# Patient Record
Sex: Male | Born: 1940 | Race: White | Hispanic: No | Marital: Married | Smoking: Never smoker
Health system: Southern US, Community
[De-identification: ages and names within clinical notes are randomized; demographics above are authoritative.]

## PROBLEM LIST (undated history)

## (undated) DIAGNOSIS — I219 Acute myocardial infarction, unspecified: Secondary | ICD-10-CM

## (undated) DIAGNOSIS — I499 Cardiac arrhythmia, unspecified: Secondary | ICD-10-CM

## (undated) DIAGNOSIS — I10 Essential (primary) hypertension: Secondary | ICD-10-CM

## (undated) HISTORY — DX: Essential (primary) hypertension: I10

## (undated) HISTORY — PX: INSERT / REPLACE / REMOVE PACEMAKER: SUR710

## (undated) HISTORY — PX: CORONARY ARTERY BYPASS GRAFT: SHX141

## (undated) HISTORY — PX: HERNIA REPAIR: SHX51

---

## 2007-12-01 ENCOUNTER — Ambulatory Visit (HOSPITAL_COMMUNITY): Admission: RE | Admit: 2007-12-01 | Discharge: 2007-12-01 | Payer: Self-pay | Admitting: General Surgery

## 2008-12-06 ENCOUNTER — Ambulatory Visit (HOSPITAL_COMMUNITY): Admission: RE | Admit: 2008-12-06 | Discharge: 2008-12-06 | Payer: Self-pay | Admitting: General Surgery

## 2008-12-06 ENCOUNTER — Encounter (INDEPENDENT_AMBULATORY_CARE_PROVIDER_SITE_OTHER): Payer: Self-pay | Admitting: General Surgery

## 2010-10-30 LAB — CBC
HCT: 41.3 % (ref 39.0–52.0)
Hemoglobin: 14.3 g/dL (ref 13.0–17.0)
MCHC: 34.7 g/dL (ref 30.0–36.0)
MCV: 91.8 fL (ref 78.0–100.0)
RBC: 4.5 MIL/uL (ref 4.22–5.81)
WBC: 6.9 10*3/uL (ref 4.0–10.5)

## 2010-10-30 LAB — DIFFERENTIAL
Basophils Relative: 0 % (ref 0–1)
Eosinophils Absolute: 0.2 10*3/uL (ref 0.0–0.7)
Lymphs Abs: 1.4 10*3/uL (ref 0.7–4.0)
Monocytes Absolute: 0.8 10*3/uL (ref 0.1–1.0)
Monocytes Relative: 11 % (ref 3–12)
Neutrophils Relative %: 66 % (ref 43–77)

## 2010-10-30 LAB — BASIC METABOLIC PANEL
CO2: 29 mEq/L (ref 19–32)
Chloride: 107 mEq/L (ref 96–112)
GFR calc Af Amer: >60 mL/min (ref >60)
Potassium: 4.4 mEq/L (ref 3.5–5.1)
Sodium: 140 mEq/L (ref 135–145)

## 2010-12-04 NOTE — Op Note (Signed)
NAMEMARKUS, [name]                ACCOUNT NO.:  [id number]   MEDICAL RECORD NO.:  [id number]          PATIENT TYPE:  AMB   LOCATION:  SDS                          FACILITY:  [facility]   PHYSICIAN:  Adolph Pollack, M.D.DATE OF BIRTH:  05/17/1941   DATE OF PROCEDURE:  12/01/2007  DATE OF DISCHARGE:                               OPERATIVE REPORT   PREOPERATIVE DIAGNOSIS:  Right inguinal hernia.   POSTOPERATIVE DIAGNOSIS:  Right inguinal hernia (pantaloon).   PROCEDURE:  Right inguinal hernia repair with mesh.   SURGEON:  Adolph Pollack, MD   ANESTHESIA:  General with Marcaine local.   INDICATIONS:  This is a 70 year old male who has had a right inguinal  hernia for some time.  It is becoming larger and now symptomatic.  He  now presents for repair.  The procedure risks and aftercare instructions  were explained to him preoperatively.   TECHNIQUE:  He was seen in the holding area and the right groin marked  with my initials.  He was then brought to the operating room, placed  supine on the operating table, and general anesthetic administered.  Hair on the right groin was clipped and the right groin was sterilely  prepped and draped.  Local anesthetic was infiltrated in the right  groin, superficial and deep.  An incision was made in the right groin  through the skin, subcutaneous tissue, and Scarpa fascia until the  external oblique aponeurosis was exposed.  Local anesthetic was  infiltrated deep to the external oblique aponeurosis.  An incision was  made in the external oblique aponeurosis through the external ring  medially and up toward the anterior-superior iliac spine laterally.  A  large underlying hernia was noted.  Using careful blunt dissection, I  was able to reduce the hernia from the scrotal area.  I then was able to  isolate the spermatic cord and using blunt dissection to separate it  from the hernia contents.  He had both an indirect and a direct hernia,  thus  a pantaloon hernia.  I was then able to manually reduce the hernia  contents back into the extraperitoneal space.  I left the ilioinguinal  nerve with the cord contents and identified the iliohypogastric nerve.  Using blunt dissection, I identified the internal oblique muscle and  aponeurosis superiorly and the shelving edge of the inguinal ligament  inferiorly.   I brought a piece of 3 x 6 inches polypropylene mesh into the field and  anchored it 2 cm medial to the pubic tubercle with 2-0 Prolene suture.  The inferior aspect of the mesh was then anchored to the shelving edge  of the inguinal ligament with a running 2-0 Prolene suture to a level of  1-2 cm lateral to the internal ring.  A slit was cut in the mesh and 2  tails were wrapped around the cord.  The superior aspect of the mesh was  then anchored to the internal oblique aponeurosis with interrupted 2-0  Vicryl sutures.  Following this, the 2 tails of the mesh were crossed  creating a new internal  ring and these were anchored to the shelving  edge of the inguinal ligament with a 2-0 Prolene suture.  This provided  for good coverage of the hernia defect with good reduction.   I then tucked the lateral aspect of mesh deep to the external oblique  aponeurosis.  Hemostasis was adequate.  I closed the external oblique  aponeurosis over the mesh and cord with running 3-0 Vicryl suture.  Scarpa fascia was closed with running 2-0 Vicryl suture.  The skin was  closed with a 4-0 Monocryl subcuticular stitch followed by Steri-Strips  and sterile dressing.  Right testicle was at its normal position in the  scrotum.   He tolerated the procedure well without any apparent complications and  was taken to recovery room in satisfactory condition.      Adolph Pollack, M.D.  Electronically Signed     TJR/MEDQ  D:  12/01/2007  T:  12/02/2007  Job:  [id number]   cc:   Dossie Der, MD

## 2010-12-04 NOTE — Op Note (Signed)
[name], [name]                ACCOUNT NO.:  [id number]   MEDICAL RECORD NO.:  [id number]          PATIENT TYPE:  AMB   LOCATION:  SDS                          FACILITY:  [facility]   PHYSICIAN:  Adolph Pollack, M.D.DATE OF BIRTH:  11/23/1940   DATE OF PROCEDURE:  12/06/2008  DATE OF DISCHARGE:  12/06/2008                               OPERATIVE REPORT   PREOPERATIVE DIAGNOSIS:  Left inguinal hernia.   POSTOPERATIVE DIAGNOSIS:  Left inguinal hernia (pantaloon).   PROCEDURE:  Left inguinal hernia with mesh.   SURGEON:  Adolph Pollack, MD   ANESTHESIA:  General plus Marcaine local.   INDICATIONS:  This is a 70 year old male who had a right inguinal hernia  repair in the past.  He slipped off his tractor and felt a tearing  sensation in left groin and now he has a left inguinal hernia that is  quite a bit more painful than his right inguinal hernia.  He presents  for repair.  We have discussed the procedure risks and aftercare  preoperatively.   TECHNIQUE:  His left inguinal area was marked with my initials in the  holding room.  He was then brought to the operating room, placed supine  on the operating table and a general anesthetic was administered.  The  hair in the left groin was clipped and the area was sterilely prepped  and draped.  Local anesthetic consisting of 0.5% Marcaine with  epinephrine was infiltrated in the left groin region superficially and  deep.  A left groin incision was made through the skin, subcutaneous  tissue and Scarpa fascia until the external oblique aponeurosis was  exposed.  Local anesthetic was infiltrated deep to the external oblique  aponeurosis.  An incision was made in the external oblique aponeurosis  through the external ring medially and up toward the anterior superior  iliac spine laterally.  The underlying ilioinguinal nerve was identified  and preserved.   Using blunt dissection, the shelving edge of the inguinal ligament was  identified inferiorly and internal oblique aponeurosis was identified  superiorly.  At the level of the pubic tubercle, the spermatic cord was  encircled and I noticed both direct hernia and indirect hernia  consistent with a pantaloon type hernia.  I dissected the direct hernia  extraperitoneal fat from the cord.  I then dissected the indirect sac  from the cord and excised some of the extra peritoneal fat setting it as  hernia contents.  There was a large internal ring defect present.   Following this, I retracted the cord structures anteriorly.  A piece of  3- x 6-inch polypropylene mesh and brought into the field and anchored 2  cm medial to the pubic tubercle with a 2-0 Prolene suture.  The inferior  aspect of the mesh was anchored to the shelving edge of the inguinal  ligament and the running 2-0 Prolene suture up to level 2 cm lateral to  the internal ring.  A slit was cut in the mesh creating 2 tails and  these were wrapped around spermatic cord.  The superior aspect of  the  mesh was then anchored to the internal oblique aponeurosis with  interrupted 2-0 Vicryl sutures.  This provided for adequate  coverage of  the indirect and indirect defects.   Following this, the 2 tails were crossed creating a new tight internal  ring and this was anchored to the shelving edge of the inguinal ligament  with a single 2-0 Prolene suture.  The lateral aspect of the mesh was  then tucked deep to the external oblique aponeurosis.   The wound was inspected and hemostasis was adequate.  The external  oblique aponeurosis was closed over the mesh and cord with running 3-0  Vicryl suture.  Scarpa fascia was reapproximated 2-0 Vicryl suture.  The  skin was closed with a 4-0 Monocryl subcuticular stitch followed by  Steri-Strips and sterile dressings.   He tolerated the procedure well without any apparent complications.  The  left testicle was in its normal position and scrotum.  He was taken to  the  recovery room in satisfactory condition.      Adolph Pollack, M.D.  Electronically Signed     TJR/MEDQ  D:  12/06/2008  T:  12/06/2008  Job:  [id number]

## 2011-09-10 ENCOUNTER — Other Ambulatory Visit: Payer: Self-pay | Admitting: Orthopedic Surgery

## 2011-09-10 DIAGNOSIS — M545 Low back pain: Secondary | ICD-10-CM

## 2011-09-16 ENCOUNTER — Other Ambulatory Visit: Payer: Self-pay | Admitting: Orthopedic Surgery

## 2011-09-16 DIAGNOSIS — M545 Low back pain: Secondary | ICD-10-CM

## 2011-09-17 ENCOUNTER — Ambulatory Visit
Admission: RE | Admit: 2011-09-17 | Discharge: 2011-09-17 | Disposition: A | Discharge status: Home / Self Care | Payer: Medicare Other | Source: Ambulatory Visit | Attending: Orthopedic Surgery | Admitting: Orthopedic Surgery

## 2011-09-17 DIAGNOSIS — M545 Low back pain: Secondary | ICD-10-CM

## 2011-11-29 ENCOUNTER — Encounter (HOSPITAL_COMMUNITY): Payer: Self-pay | Admitting: Pharmacy Technician

## 2011-11-29 ENCOUNTER — Other Ambulatory Visit: Payer: Self-pay | Admitting: Orthopedic Surgery

## 2011-12-03 ENCOUNTER — Encounter (HOSPITAL_COMMUNITY)
Admission: RE | Admit: 2011-12-03 | Discharge: 2011-12-03 | Disposition: A | Discharge status: Home / Self Care | Payer: Medicare Other | Source: Ambulatory Visit | Attending: Orthopedic Surgery | Admitting: Orthopedic Surgery

## 2011-12-03 ENCOUNTER — Encounter (HOSPITAL_COMMUNITY): Payer: Self-pay

## 2011-12-03 HISTORY — DX: Acute myocardial infarction, unspecified: I21.9

## 2011-12-03 HISTORY — DX: Cardiac arrhythmia, unspecified: I49.9

## 2011-12-03 LAB — CBC
Hemoglobin: 14.4 g/dL (ref 13.0–17.0)
Platelets: 242 10*3/uL (ref 150–400)
RBC: 4.55 MIL/uL (ref 4.22–5.81)
WBC: 11.9 10*3/uL — ABNORMAL HIGH (ref 4.0–10.5)

## 2011-12-03 LAB — DIFFERENTIAL
Lymphs Abs: 1.4 10*3/uL (ref 0.7–4.0)
Monocytes Relative: 12 % (ref 3–12)
Neutro Abs: 8.9 10*3/uL — ABNORMAL HIGH (ref 1.7–7.7)
Neutrophils Relative %: 75 % (ref 43–77)

## 2011-12-03 LAB — SURGICAL PCR SCREEN
MRSA, PCR: NEGATIVE
Staphylococcus aureus: NEGATIVE

## 2011-12-03 LAB — COMPREHENSIVE METABOLIC PANEL
AST: 19 U/L (ref 0–37)
Albumin: 3.8 g/dL (ref 3.5–5.2)
Alkaline Phosphatase: 41 U/L (ref 39–117)
Chloride: 103 mEq/L (ref 96–112)
Creatinine, Ser: 0.76 mg/dL (ref 0.50–1.35)
Potassium: 4.3 mEq/L (ref 3.5–5.1)
Total Bilirubin: 0.3 mg/dL (ref 0.3–1.2)
Total Protein: 7.2 g/dL (ref 6.0–8.3)

## 2011-12-03 LAB — ABO/RH: ABO/RH(D): A NEG

## 2011-12-03 LAB — URINALYSIS, ROUTINE W REFLEX MICROSCOPIC
Leukocytes, UA: NEGATIVE
Nitrite: NEGATIVE
Specific Gravity, Urine: 1.024 (ref 1.005–1.030)
pH: 5.5 (ref 5.0–8.0)

## 2011-12-03 LAB — APTT: aPTT: 29 seconds (ref 24–37)

## 2011-12-03 LAB — TYPE AND SCREEN: Antibody Screen: NEGATIVE

## 2011-12-03 LAB — PROTIME-INR
INR: 0.94 (ref 0.00–1.49)
Prothrombin Time: 12.8 seconds (ref 11.6–15.2)

## 2011-12-03 NOTE — Progress Notes (Signed)
Leta Jungling with [employer] notified

## 2011-12-03 NOTE — Pre-Procedure Instructions (Signed)
20 [name]  12/03/2011   Your procedure is scheduled on:  12/05/11  Report to [facility] at 845 AM.  Call this number if you have problems the morning of surgery: [phone]   Remember:   Do not eat food:After Midnight.  May have clear liquids: up to 4 Hours before arrival.  Clear liquids include soda, tea, black coffee, apple or grape juice, broth.  Take these medicines the morning of surgery with A SIP OF WATER: norvasc,lanoxin,eye drops,   Do not wear jewelry, make-up or nail polish.  Do not wear lotions, powders, or perfumes. You may wear deodorant.  Do not shave 48 hours prior to surgery. Men may shave face and neck.  Do not bring valuables to the hospital.  Contacts, dentures or bridgework may not be worn into surgery.  Leave suitcase in the car. After surgery it may be brought to your room.  For patients admitted to the hospital, checkout time is 11:00 AM the day of discharge.   Patients discharged the day of surgery will not be allowed to drive home.  Name and phone number of your driver: family  Special Instructions: CHG Shower Use Special Wash: 1/2 bottle night before surgery and 1/2 bottle morning of surgery.   Please read over the following fact sheets that you were given: Pain Booklet, Coughing and Deep Breathing, Blood Transfusion Information, MRSA Information and Surgical Site Infection Prevention

## 2011-12-03 NOTE — Progress Notes (Signed)
Form faxed to dr Graciela Husbands  In [place]. [place].

## 2011-12-03 NOTE — Progress Notes (Signed)
#   [id number]      Requested all ccardiac info ekg,stress  test  From dr Graciela Husbands.Marland Kitchen

## 2011-12-04 ENCOUNTER — Encounter (HOSPITAL_COMMUNITY): Payer: Self-pay | Admitting: Vascular Surgery

## 2011-12-04 MED ORDER — CEFAZOLIN SODIUM-DEXTROSE 2-3 GM-% IV SOLR
2.0000 g | INTRAVENOUS | Status: DC
  Filled 2011-12-04: qty 50

## 2011-12-04 NOTE — Consult Note (Addendum)
Anesthesia Chart Review:  Patient is a 71 year old male scheduled for left L4-5 transforaminal lumbar interbody fusion on 12/05/11.  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  Cardiologist is Dr. Graciela Husbands [phone]) in [place], [place].  He was last seen on 09/26/11 for preoperative evaluation.    EKG from 12/03/11 showed: NSR, LVH with repolarization abnormality, cannot rule out inferior infarct (age undetermined).  Cardiology notes say he had a CABG X 3 '04 (LIMA to LAD, SVG to D1, SVG to PDA) with last LHC in 06/09/09 showing all grafts patent.  They also say he had an echo on 02/12/11 showing EF 40-45%, grade I diastolic dysfunction, mild MR, aortic sclerosis, early LVH, moderate AR, trace TR, and four chamber enlargement.       Labs acceptable.  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 Postoperative changes of CABG.   I spoke with Brook at [facility] office.  Patient is going to their office today (12/04/11) around 1500 for his pacemaker check.  She said his perioperative cardiac device Rx form will be faxed after his visit.  Addendum: 12/04/11 1630 Reviewed additional records from [facility] and [facility].  His last cath was on 06/09/09, although grafts were patent he did have a complex ~ 80% lesion at the anastomotic site of the SVG to PDA graft.  He was transferred to [facility] for consideration of PCI; however, PCI was ultimately not felt justified and medical management was recommended.  Shonna Chock, PA-C

## 2011-12-05 ENCOUNTER — Inpatient Hospital Stay (HOSPITAL_COMMUNITY): Payer: Medicare Other

## 2011-12-05 ENCOUNTER — Inpatient Hospital Stay (HOSPITAL_COMMUNITY)
Admission: RE | Admit: 2011-12-05 | Discharge: 2011-12-07 | DRG: 460 | Disposition: A | Discharge status: Home Health | Payer: Medicare Other | Source: Ambulatory Visit | Attending: Orthopedic Surgery | Admitting: Orthopedic Surgery

## 2011-12-05 ENCOUNTER — Encounter (HOSPITAL_COMMUNITY): Payer: Self-pay | Admitting: Vascular Surgery

## 2011-12-05 ENCOUNTER — Inpatient Hospital Stay (HOSPITAL_COMMUNITY): Payer: Medicare Other | Admitting: Vascular Surgery

## 2011-12-05 ENCOUNTER — Encounter (HOSPITAL_COMMUNITY): Payer: Self-pay | Admitting: *Deleted

## 2011-12-05 ENCOUNTER — Encounter (HOSPITAL_COMMUNITY)
Admission: RE | Disposition: A | Discharge status: Home Health | Payer: Self-pay | Source: Ambulatory Visit | Attending: Orthopedic Surgery

## 2011-12-05 DIAGNOSIS — Z01818 Encounter for other preprocedural examination: Secondary | ICD-10-CM

## 2011-12-05 DIAGNOSIS — M545 Low back pain: Secondary | ICD-10-CM

## 2011-12-05 DIAGNOSIS — H409 Unspecified glaucoma: Secondary | ICD-10-CM | POA: Diagnosis present

## 2011-12-05 DIAGNOSIS — Z951 Presence of aortocoronary bypass graft: Secondary | ICD-10-CM

## 2011-12-05 DIAGNOSIS — Z01812 Encounter for preprocedural laboratory examination: Secondary | ICD-10-CM

## 2011-12-05 DIAGNOSIS — Z7902 Long term (current) use of antithrombotics/antiplatelets: Secondary | ICD-10-CM

## 2011-12-05 DIAGNOSIS — M5126 Other intervertebral disc displacement, lumbar region: Secondary | ICD-10-CM | POA: Diagnosis present

## 2011-12-05 DIAGNOSIS — Z79899 Other long term (current) drug therapy: Secondary | ICD-10-CM

## 2011-12-05 DIAGNOSIS — Z7982 Long term (current) use of aspirin: Secondary | ICD-10-CM

## 2011-12-05 DIAGNOSIS — Q762 Congenital spondylolisthesis: Principal | ICD-10-CM | POA: Diagnosis present

## 2011-12-05 DIAGNOSIS — I10 Essential (primary) hypertension: Secondary | ICD-10-CM | POA: Diagnosis present

## 2011-12-05 DIAGNOSIS — Z01810 Encounter for preprocedural cardiovascular examination: Secondary | ICD-10-CM

## 2011-12-05 DIAGNOSIS — I252 Old myocardial infarction: Secondary | ICD-10-CM

## 2011-12-05 DIAGNOSIS — M5417 Radiculopathy, lumbosacral region: Secondary | ICD-10-CM | POA: Diagnosis present

## 2011-12-05 SURGERY — POSTERIOR LUMBAR FUSION 1 LEVEL
Anesthesia: General | Site: Spine Lumbar | Laterality: Left | Wound class: Clean

## 2011-12-05 MED ORDER — THROMBIN 20000 UNITS EX KIT
PACK | CUTANEOUS | Status: DC | PRN
  Administered 2011-12-05: 14:00:00 via TOPICAL

## 2011-12-05 MED ORDER — ADULT MULTIVITAMIN W/MINERALS CH
1.0000 | ORAL_TABLET | Freq: Every day | ORAL | Status: DC
  Administered 2011-12-06 – 2011-12-07 (×2): 1 via ORAL
  Filled 2011-12-05 (×2): qty 1

## 2011-12-05 MED ORDER — BUPIVACAINE-EPINEPHRINE 0.25% -1:200000 IJ SOLN
INTRAMUSCULAR | Status: DC | PRN
  Administered 2011-12-05: 6 mL

## 2011-12-05 MED ORDER — ATORVASTATIN CALCIUM 80 MG PO TABS
80.0000 mg | ORAL_TABLET | Freq: Every day | ORAL | Status: DC
  Administered 2011-12-05 – 2011-12-06 (×2): 80 mg via ORAL
  Filled 2011-12-05 (×3): qty 1

## 2011-12-05 MED ORDER — PROPOFOL 10 MG/ML IV EMUL
INTRAVENOUS | Status: DC | PRN
  Administered 2011-12-05: 115 mg via INTRAVENOUS

## 2011-12-05 MED ORDER — ACETAMINOPHEN 10 MG/ML IV SOLN
INTRAVENOUS | Status: AC
  Filled 2011-12-05: qty 100

## 2011-12-05 MED ORDER — ZOLPIDEM TARTRATE 5 MG PO TABS: 5.0000 mg | ORAL_TABLET | Freq: Every evening | ORAL | Status: DC | PRN

## 2011-12-05 MED ORDER — LOSARTAN POTASSIUM 50 MG PO TABS
100.0000 mg | ORAL_TABLET | Freq: Every day | ORAL | Status: DC
Start: 2011-12-06 — End: 2011-12-07
  Administered 2011-12-06 – 2011-12-07 (×2): 100 mg via ORAL
  Filled 2011-12-05 (×2): qty 2

## 2011-12-05 MED ORDER — MENTHOL 3 MG MT LOZG: 1.0000 | LOZENGE | OROMUCOSAL | Status: DC | PRN

## 2011-12-05 MED ORDER — AMLODIPINE BESYLATE 5 MG PO TABS
5.0000 mg | ORAL_TABLET | Freq: Every day | ORAL | Status: DC
  Administered 2011-12-06 – 2011-12-07 (×2): 5 mg via ORAL
  Filled 2011-12-05 (×2): qty 1

## 2011-12-05 MED ORDER — NITROGLYCERIN 0.4 MG SL SUBL: 0.4000 mg | SUBLINGUAL_TABLET | SUBLINGUAL | Status: DC | PRN

## 2011-12-05 MED ORDER — ACETAMINOPHEN 325 MG PO TABS: 650.0000 mg | ORAL_TABLET | ORAL | Status: DC | PRN

## 2011-12-05 MED ORDER — NALOXONE HCL 0.4 MG/ML IJ SOLN: 0.4000 mg | INTRAMUSCULAR | Status: DC | PRN

## 2011-12-05 MED ORDER — PILOCARPINE HCL 4 % OP SOLN
1.0000 [drp] | Freq: Four times a day (QID) | OPHTHALMIC | Status: DC
  Administered 2011-12-05 – 2011-12-07 (×4): 1 [drp] via OPHTHALMIC
  Filled 2011-12-05: qty 15

## 2011-12-05 MED ORDER — VITAMIN D3 25 MCG (1000 UNIT) PO TABS
2000.0000 [IU] | ORAL_TABLET | Freq: Every day | ORAL | Status: DC
  Administered 2011-12-05 – 2011-12-07 (×3): 2000 [IU] via ORAL
  Filled 2011-12-05 (×3): qty 2

## 2011-12-05 MED ORDER — ONDANSETRON HCL 4 MG/2ML IJ SOLN: 4.0000 mg | Freq: Four times a day (QID) | INTRAMUSCULAR | Status: DC | PRN

## 2011-12-05 MED ORDER — EPHEDRINE SULFATE 50 MG/ML IJ SOLN
INTRAMUSCULAR | Status: DC | PRN
  Administered 2011-12-05: 5 mg via INTRAVENOUS
  Administered 2011-12-05 (×2): 10 mg via INTRAVENOUS

## 2011-12-05 MED ORDER — SODIUM CHLORIDE 0.9 % IJ SOLN: 9.0000 mL | INTRAMUSCULAR | Status: DC | PRN

## 2011-12-05 MED ORDER — DIPHENHYDRAMINE HCL 12.5 MG/5ML PO ELIX: 12.5000 mg | ORAL_SOLUTION | Freq: Four times a day (QID) | ORAL | Status: DC | PRN

## 2011-12-05 MED ORDER — SODIUM CHLORIDE 0.9 % IV SOLN: 250.0000 mL | INTRAVENOUS | Status: DC

## 2011-12-05 MED ORDER — PNEUMOCOCCAL VAC POLYVALENT 25 MCG/0.5ML IJ INJ
0.5000 mL | INJECTION | INTRAMUSCULAR | Status: DC
  Filled 2011-12-05: qty 0.5

## 2011-12-05 MED ORDER — ALBUMIN HUMAN 5 % IV SOLN
INTRAVENOUS | Status: DC | PRN
  Administered 2011-12-05: 13:00:00 via INTRAVENOUS

## 2011-12-05 MED ORDER — KETOROLAC TROMETHAMINE 30 MG/ML IJ SOLN: 15.0000 mg | Freq: Once | INTRAMUSCULAR | Status: DC | PRN

## 2011-12-05 MED ORDER — DIAZEPAM 5 MG PO TABS: 5.0000 mg | ORAL_TABLET | Freq: Four times a day (QID) | ORAL | Status: DC | PRN

## 2011-12-05 MED ORDER — OXYCODONE-ACETAMINOPHEN 5-325 MG PO TABS
1.0000 | ORAL_TABLET | ORAL | Status: DC | PRN
  Administered 2011-12-06 – 2011-12-07 (×3): 1 via ORAL
  Filled 2011-12-05 (×3): qty 1

## 2011-12-05 MED ORDER — DORZOLAMIDE HCL-TIMOLOL MAL 2-0.5 % OP SOLN
1.0000 [drp] | Freq: Two times a day (BID) | OPHTHALMIC | Status: DC
  Administered 2011-12-05 – 2011-12-07 (×3): 1 [drp] via OPHTHALMIC
  Filled 2011-12-05: qty 10

## 2011-12-05 MED ORDER — DOCUSATE SODIUM 100 MG PO CAPS
100.0000 mg | ORAL_CAPSULE | Freq: Two times a day (BID) | ORAL | Status: DC
  Administered 2011-12-05 – 2011-12-07 (×4): 100 mg via ORAL
  Filled 2011-12-05 (×5): qty 1

## 2011-12-05 MED ORDER — OXYCODONE HCL 10 MG PO TB12
10.0000 mg | ORAL_TABLET | Freq: Two times a day (BID) | ORAL | Status: DC
  Administered 2011-12-06 – 2011-12-07 (×3): 10 mg via ORAL
  Filled 2011-12-05 (×3): qty 1

## 2011-12-05 MED ORDER — SENNA 8.6 MG PO TABS
1.0000 | ORAL_TABLET | Freq: Two times a day (BID) | ORAL | Status: DC
  Administered 2011-12-05 – 2011-12-07 (×4): 8.6 mg via ORAL
  Filled 2011-12-05 (×5): qty 1

## 2011-12-05 MED ORDER — MORPHINE SULFATE 2 MG/ML IJ SOLN: 2.0000 mg | INTRAMUSCULAR | Status: DC | PRN

## 2011-12-05 MED ORDER — LACTATED RINGERS IV SOLN
INTRAVENOUS | Status: DC
  Administered 2011-12-05: 10:00:00 via INTRAVENOUS

## 2011-12-05 MED ORDER — FENTANYL CITRATE 0.05 MG/ML IJ SOLN
INTRAMUSCULAR | Status: DC | PRN
  Administered 2011-12-05 (×2): 50 ug via INTRAVENOUS
  Administered 2011-12-05 (×4): 100 ug via INTRAVENOUS

## 2011-12-05 MED ORDER — SODIUM CHLORIDE 0.9 % IJ SOLN: 3.0000 mL | INTRAMUSCULAR | Status: DC | PRN

## 2011-12-05 MED ORDER — ACETAMINOPHEN 10 MG/ML IV SOLN
INTRAVENOUS | Status: DC | PRN
  Administered 2011-12-05: 1000 mg via INTRAVENOUS

## 2011-12-05 MED ORDER — ONDANSETRON HCL 4 MG/2ML IJ SOLN: 4.0000 mg | INTRAMUSCULAR | Status: DC | PRN

## 2011-12-05 MED ORDER — ONDANSETRON HCL 4 MG/2ML IJ SOLN
INTRAMUSCULAR | Status: DC | PRN
  Administered 2011-12-05: 4 mg via INTRAVENOUS

## 2011-12-05 MED ORDER — ALUM & MAG HYDROXIDE-SIMETH 200-200-20 MG/5ML PO SUSP: 30.0000 mL | Freq: Four times a day (QID) | ORAL | Status: DC | PRN

## 2011-12-05 MED ORDER — POTASSIUM CHLORIDE IN NACL 20-0.9 MEQ/L-% IV SOLN
INTRAVENOUS | Status: DC
  Administered 2011-12-06: 06:00:00 via INTRAVENOUS
  Filled 2011-12-05 (×5): qty 1000

## 2011-12-05 MED ORDER — DIGOXIN 125 MCG PO TABS
125.0000 ug | ORAL_TABLET | Freq: Every day | ORAL | Status: DC
  Administered 2011-12-06 – 2011-12-07 (×2): 125 ug via ORAL
  Filled 2011-12-05 (×2): qty 1

## 2011-12-05 MED ORDER — MORPHINE SULFATE (PF) 1 MG/ML IV SOLN
INTRAVENOUS | Status: DC
  Administered 2011-12-05: 2 mg via INTRAVENOUS
  Administered 2011-12-05: 17:00:00 via INTRAVENOUS
  Administered 2011-12-06: 2 mg via INTRAVENOUS
  Administered 2011-12-06: 4 mg via INTRAVENOUS

## 2011-12-05 MED ORDER — PHENOL 1.4 % MT LIQD: 1.0000 | OROMUCOSAL | Status: DC | PRN

## 2011-12-05 MED ORDER — ROCURONIUM BROMIDE 100 MG/10ML IV SOLN
INTRAVENOUS | Status: DC | PRN
  Administered 2011-12-05: 50 mg via INTRAVENOUS

## 2011-12-05 MED ORDER — PHENYLEPHRINE HCL 10 MG/ML IJ SOLN
INTRAMUSCULAR | Status: DC | PRN
  Administered 2011-12-05: 120 ug via INTRAVENOUS
  Administered 2011-12-05: 40 ug via INTRAVENOUS
  Administered 2011-12-05 (×2): 100 ug via INTRAVENOUS

## 2011-12-05 MED ORDER — PROMETHAZINE HCL 25 MG/ML IJ SOLN: 6.2500 mg | INTRAMUSCULAR | Status: DC | PRN

## 2011-12-05 MED ORDER — VECURONIUM BROMIDE 10 MG IV SOLR
INTRAVENOUS | Status: DC | PRN
  Administered 2011-12-05: 4 mg via INTRAVENOUS
  Administered 2011-12-05 (×2): 1 mg via INTRAVENOUS

## 2011-12-05 MED ORDER — POVIDONE-IODINE 7.5 % EX SOLN
Freq: Once | CUTANEOUS | Status: DC
  Filled 2011-12-05: qty 118

## 2011-12-05 MED ORDER — DIPHENHYDRAMINE HCL 50 MG/ML IJ SOLN: 12.5000 mg | Freq: Four times a day (QID) | INTRAMUSCULAR | Status: DC | PRN

## 2011-12-05 MED ORDER — CEFAZOLIN SODIUM 1-5 GM-% IV SOLN
1.0000 g | Freq: Three times a day (TID) | INTRAVENOUS | Status: AC
  Administered 2011-12-05 – 2011-12-06 (×2): 1 g via INTRAVENOUS
  Filled 2011-12-05 (×2): qty 50

## 2011-12-05 MED ORDER — LACTATED RINGERS IV SOLN
INTRAVENOUS | Status: DC | PRN
  Administered 2011-12-05 (×2): via INTRAVENOUS

## 2011-12-05 MED ORDER — ACETAMINOPHEN 650 MG RE SUPP: 650.0000 mg | RECTAL | Status: DC | PRN

## 2011-12-05 MED ORDER — VITAMIN D 50 MCG (2000 UT) PO CAPS: 1.0000 | ORAL_CAPSULE | Freq: Every day | ORAL | Status: DC

## 2011-12-05 MED ORDER — SODIUM CHLORIDE 0.9 % IJ SOLN
3.0000 mL | Freq: Two times a day (BID) | INTRAMUSCULAR | Status: DC
  Administered 2011-12-06 – 2011-12-07 (×2): 3 mL via INTRAVENOUS

## 2011-12-05 MED ORDER — HYDROMORPHONE HCL PF 1 MG/ML IJ SOLN: 0.2500 mg | INTRAMUSCULAR | Status: DC | PRN

## 2011-12-05 MED ORDER — THROMBIN 20000 UNITS EX SOLR
CUTANEOUS | Status: DC | PRN
  Administered 2011-12-05: 20000 [IU] via TOPICAL

## 2011-12-05 MED ORDER — LIDOCAINE HCL (CARDIAC) 20 MG/ML IV SOLN
INTRAVENOUS | Status: DC | PRN
  Administered 2011-12-05: 100 mg via INTRAVENOUS

## 2011-12-05 SURGICAL SUPPLY — 73 items
BENZOIN TINCTURE PRP APPL 2/3 (GAUZE/BANDAGES/DRESSINGS) IMPLANT
BLADE SURG ROTATE 9660 (MISCELLANEOUS) ×2 IMPLANT
BUR ROUND PRECISION 4.0 (BURR) ×2 IMPLANT
CAGE CONCORDE BULLET 11X13X27 (Cage) ×2 IMPLANT
CARTRIDGE OIL MAESTRO DRILL (MISCELLANEOUS) ×1 IMPLANT
CLOTH BEACON ORANGE TIMEOUT ST (SAFETY) ×2 IMPLANT
CLSR STERI-STRIP ANTIMIC 1/2X4 (GAUZE/BANDAGES/DRESSINGS) ×2 IMPLANT
CONT SPEC STER OR (MISCELLANEOUS) ×4 IMPLANT
CORDS BIPOLAR (ELECTRODE) ×2 IMPLANT
COVER SURGICAL LIGHT HANDLE (MISCELLANEOUS) ×2 IMPLANT
DIFFUSER DRILL AIR PNEUMATIC (MISCELLANEOUS) ×2 IMPLANT
DRAIN CHANNEL 15F RND FF W/TCR (WOUND CARE) IMPLANT
DRAPE C-ARM 42X72 X-RAY (DRAPES) ×2 IMPLANT
DRAPE ORTHO SPLIT 77X108 STRL (DRAPES) ×1
DRAPE POUCH INSTRU U-SHP 10X18 (DRAPES) ×2 IMPLANT
DRAPE SURG 17X23 STRL (DRAPES) ×6 IMPLANT
DRAPE SURG ORHT 6 SPLT 77X108 (DRAPES) ×1 IMPLANT
DURAPREP 26ML APPLICATOR (WOUND CARE) ×2 IMPLANT
ELECT BLADE 4.0 EZ CLEAN MEGAD (MISCELLANEOUS) ×2
ELECT CAUTERY BLADE 6.4 (BLADE) ×2 IMPLANT
ELECT REM PT RETURN 9FT ADLT (ELECTROSURGICAL) ×2
ELECTRODE BLDE 4.0 EZ CLN MEGD (MISCELLANEOUS) ×1 IMPLANT
ELECTRODE REM PT RTRN 9FT ADLT (ELECTROSURGICAL) ×1 IMPLANT
EVACUATOR SILICONE 100CC (DRAIN) IMPLANT
GAUZE SPONGE 4X4 16PLY XRAY LF (GAUZE/BANDAGES/DRESSINGS) ×8 IMPLANT
GLOVE BIO SURGEON STRL SZ8 (GLOVE) ×2 IMPLANT
GLOVE BIOGEL PI IND STRL 8 (GLOVE) ×1 IMPLANT
GLOVE BIOGEL PI INDICATOR 8 (GLOVE) ×1
GOWN PREVENTION PLUS XLARGE (GOWN DISPOSABLE) ×2 IMPLANT
GOWN STRL NON-REIN LRG LVL3 (GOWN DISPOSABLE) ×4 IMPLANT
IV CATH 14GX2 1/4 (CATHETERS) ×2 IMPLANT
KIT BASIN OR (CUSTOM PROCEDURE TRAY) ×2 IMPLANT
KIT POSITION SURG JACKSON T1 (MISCELLANEOUS) ×2 IMPLANT
KIT ROOM TURNOVER OR (KITS) ×2 IMPLANT
MARKER SKIN DUAL TIP RULER LAB (MISCELLANEOUS) ×2 IMPLANT
NEEDLE BONE MARROW 8GX6 FENEST (NEEDLE) ×2 IMPLANT
NEEDLE HYPO 25GX1X1/2 BEV (NEEDLE) ×2 IMPLANT
NEEDLE SPNL 18GX3.5 QUINCKE PK (NEEDLE) ×4 IMPLANT
NS IRRIG 1000ML POUR BTL (IV SOLUTION) ×2 IMPLANT
OIL CARTRIDGE MAESTRO DRILL (MISCELLANEOUS) ×2
PACK LAMINECTOMY ORTHO (CUSTOM PROCEDURE TRAY) ×2 IMPLANT
PACK UNIVERSAL I (CUSTOM PROCEDURE TRAY) ×2 IMPLANT
PACK VITOSS BIOACTIVE 10CC (Neuro Prosthesis/Implant) ×4 IMPLANT
PAD ARMBOARD 7.5X6 YLW CONV (MISCELLANEOUS) ×4 IMPLANT
PATTIES SURGICAL .5 X1 (DISPOSABLE) ×2 IMPLANT
PATTIES SURGICAL .5X1.5 (GAUZE/BANDAGES/DRESSINGS) ×2 IMPLANT
ROD EXEDIUM PREBENT 5.5 40MM (Rod) ×1 IMPLANT
ROD EXEDIUM PREBENT 5.5X40 (Rod) ×1 IMPLANT
ROD EXPEDIUM 5.5 45MM (Rod) ×2 IMPLANT
SCREW POLYAXIAL 7X45MM (Screw) ×2 IMPLANT
SCREW SET SINGLE INNER (Screw) ×8 IMPLANT
SPONGE GAUZE 4X4 12PLY (GAUZE/BANDAGES/DRESSINGS) ×2 IMPLANT
SPONGE INTESTINAL PEANUT (DISPOSABLE) ×2 IMPLANT
SPONGE SURGIFOAM ABS GEL 100 (HEMOSTASIS) ×2 IMPLANT
STRIP CLOSURE SKIN 1/2X4 (GAUZE/BANDAGES/DRESSINGS) IMPLANT
SURGIFLO TRUKIT (HEMOSTASIS) ×2 IMPLANT
SUT MNCRL AB 3-0 PS2 18 (SUTURE) ×2 IMPLANT
SUT MNCRL AB 4-0 PS2 18 (SUTURE) IMPLANT
SUT VIC AB 0 CT1 18XCR BRD 8 (SUTURE) ×1 IMPLANT
SUT VIC AB 0 CT1 8-18 (SUTURE) ×1
SUT VIC AB 1 CT1 18XCR BRD 8 (SUTURE) ×2 IMPLANT
SUT VIC AB 1 CT1 8-18 (SUTURE) ×2
SUT VIC AB 2-0 CT2 18 VCP726D (SUTURE) ×2 IMPLANT
SYR 20CC LL (SYRINGE) ×2 IMPLANT
SYR 30ML SLIP (SYRINGE) ×2 IMPLANT
SYR BULB IRRIGATION 50ML (SYRINGE) ×2 IMPLANT
SYR CONTROL 10ML LL (SYRINGE) ×2 IMPLANT
TAPE CLOTH SURG 6X10 WHT LF (GAUZE/BANDAGES/DRESSINGS) ×2 IMPLANT
TOWEL OR 17X24 6PK STRL BLUE (TOWEL DISPOSABLE) ×2 IMPLANT
TOWEL OR 17X26 10 PK STRL BLUE (TOWEL DISPOSABLE) ×2 IMPLANT
TRAY FOLEY CATH 14FR (SET/KITS/TRAYS/PACK) ×2 IMPLANT
WATER STERILE IRR 1000ML POUR (IV SOLUTION) ×2 IMPLANT
YANKAUER SUCT BULB TIP NO VENT (SUCTIONS) ×2 IMPLANT

## 2011-12-05 NOTE — Transfer of Care (Signed)
Immediate Anesthesia Transfer of Care Note  Patient: [name]  Procedure(s) Performed: Procedure(s) (LRB): POSTERIOR LUMBAR FUSION 1 LEVEL (Left)  Patient Location: PACU  Anesthesia Type: General  Level of Consciousness: awake, alert , oriented and patient cooperative  Airway & Oxygen Therapy: Patient Spontanous Breathing and Patient connected to nasal cannula oxygen  Post-op Assessment: Report given to PACU RN, Post -op Vital signs reviewed and stable and Patient moving all extremities X 4  Post vital signs: Reviewed and stable  Complications: No apparent anesthesia complications

## 2011-12-05 NOTE — Preoperative (Signed)
Beta Blockers   Reason not to administer Beta Blockers:Not Applicable 

## 2011-12-05 NOTE — Anesthesia Postprocedure Evaluation (Signed)
Anesthesia Post Note  Patient: [name]  Procedure(s) Performed: Procedure(s) (LRB): POSTERIOR LUMBAR FUSION 1 LEVEL (Left)  Anesthesia type: general  Patient location: PACU  Post pain: Pain level controlled  Post assessment: Patient's Cardiovascular Status Stable  Last Vitals:  Filed Vitals:   12/05/11 1730  BP: 128/64  Pulse: 100  Temp:   Resp: 16    Post vital signs: Reviewed and stable  Level of consciousness: sedated  Complications: No apparent anesthesia complications

## 2011-12-05 NOTE — Anesthesia Preprocedure Evaluation (Addendum)
Anesthesia Evaluation  Patient identified by MRN, date of birth, ID band Patient awake    Reviewed: Allergy & Precautions, H&P , NPO status , Patient's Chart, lab work & pertinent test results  Airway Mallampati: II TM Distance: >3 FB Neck ROM: Full    Dental No notable dental hx.    Pulmonary neg pulmonary ROS,  breath sounds clear to auscultation  Pulmonary exam normal       Cardiovascular hypertension, Pt. 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 Dx 1975.  Pain 4/5 in L leg. Numbness to L leg.  negative neurological ROS  negative psych ROS   GI/Hepatic negative GI ROS, Neg liver ROS,   Endo/Other  negative endocrine ROS  Renal/GU negative Renal ROS  negative genitourinary   Musculoskeletal negative musculoskeletal ROS (+)   Abdominal   Peds negative pediatric ROS (+)  Hematology negative hematology ROS (+)   Anesthesia Other Findings   Reproductive/Obstetrics negative OB ROS                         Anesthesia Physical Anesthesia Plan  ASA: III  Anesthesia Plan: General   Post-op Pain Management:    Induction: Intravenous  Airway Management Planned: Oral ETT  Additional Equipment:   Intra-op Plan:   Post-operative Plan: Extubation in OR  Informed Consent: I have reviewed the patients History and Physical, chart, labs and discussed the procedure including the risks, benefits and alternatives for the proposed anesthesia with the patient or authorized representative who has indicated his/her understanding and acceptance.   Dental advisory given  Plan Discussed with: CRNA  Anesthesia Plan Comments:         Anesthesia Quick Evaluation

## 2011-12-05 NOTE — H&P (Signed)
PREOPERATIVE H&P  Chief Complaint: leg pain  HPI: [name] is a 71 y.o. male who presents with leg pain, ongoing despite conservative care  Past Medical History  Diagnosis Date  . Myocardial infarction     2005  . Glaucoma   . Dysrhythmia      dr Graciela Husbands  in [place]  . Hypertension    Past Surgical History  Procedure Date  . Coronary artery bypass graft     2005  . Hernia repair   . Insert / replace / remove pacemaker     [place]   dr Jolaine Artist   History   Social History  . Marital Status: Married    Spouse Name: N/A    Number of Children: N/A  . Years of Education: N/A   Social History Main Topics  . Smoking status: Never Smoker   . Smokeless tobacco: Not on file  . Alcohol Use: No  . Drug Use: No  . Sexually Active:    Other Topics Concern  . Not on file   Social History Narrative  . No narrative on file   No family history on file. No Known Allergies Prior to Admission medications   Medication Sig Start Date End Date Taking? Authorizing Provider  amLODipine (NORVASC) 5 MG tablet Take 5 mg by mouth daily.   Yes Historical Provider, MD  aspirin 325 MG EC tablet Take 325 mg by mouth daily.   Yes Historical Provider, MD  atorvastatin (LIPITOR) 80 MG tablet Take 80 mg by mouth daily.   Yes Historical Provider, MD  Cholecalciferol (VITAMIN D) 2000 UNITS CAPS Take 1 capsule by mouth daily.   Yes Historical Provider, MD  clopidogrel (PLAVIX) 75 MG tablet Take 75 mg by mouth daily.   Yes Historical Provider, MD  digoxin (LANOXIN) 0.125 MG tablet Take 125 mcg by mouth daily.   Yes Historical Provider, MD  dorzolamide-timolol (COSOPT) 22.3-6.8 MG/ML ophthalmic solution Place 1 drop into both eyes 2 (two) times daily.   Yes Historical Provider, MD  losartan (COZAAR) 100 MG tablet Take 100 mg by mouth daily.   Yes Historical Provider, MD  Multiple Vitamin (MULITIVITAMIN WITH MINERALS) TABS Take 1 tablet by mouth daily.   Yes Historical Provider, MD    nitroGLYCERIN (NITROSTAT) 0.4 MG SL tablet Place 0.4 mg under the tongue every 5 (five) minutes as needed. Chest pain.   Yes Historical Provider, MD  pilocarpine (PILOCAR) 4 % ophthalmic solution Place 1 drop into the left eye 4 (four) times daily.   Yes Historical Provider, MD     All other systems have been reviewed and were otherwise negative with the exception of those mentioned in the HPI and as above.  Physical Exam: There were no vitals filed for this visit.  General: Alert, no acute distress Cardiovascular: No pedal edema Respiratory: No cyanosis, no use of accessory musculature GI: No organomegaly, abdomen is soft and non-tender Skin: No lesions in the area of chief complaint Neurologic: Sensation intact distally Psychiatric: Patient is competent for consent with normal mood and affect Lymphatic: No axillary or cervical lymphadenopathy  MUSCULOSKELETAL: + TTP low back  Assessment/Plan: Low back pain, leg pain Plan for Procedure(s): POSTERIOR LUMBAR FUSION 1 LEVEL   Emilee Hero, MD 12/05/2011 6:27 AM

## 2011-12-06 MED ORDER — OXYCODONE HCL 10 MG PO TB12: 10.0000 mg | ORAL_TABLET | Freq: Two times a day (BID) | ORAL | Status: DC

## 2011-12-06 NOTE — Progress Notes (Signed)
Patient with minimalback pain.  Left leg pain resolved.  BP 127/78  Pulse 112  Temp(Src) 100.1 F (37.8 C) (Oral)  Resp 24  SpO2 97%  Patient appears very comfortable NVI Dressing CDI  POD #1 after L4/5 TLIF  - up with PT/OT today - percocet/valium, oxycontin for pain - likely d/c home tomorrow vs. Sunday - SCDs - back precautions at all times

## 2011-12-06 NOTE — Progress Notes (Signed)
Utilization review completed.  

## 2011-12-06 NOTE — Evaluation (Signed)
Occupational Therapy Evaluation Patient Details Name: [name] MRN: [id number] DOB: 1940/11/26 Today's Date: 12/06/2011 Time: 8469-6295 OT Time Calculation (min): 28 min  OT Assessment / Plan / Recommendation Clinical Impression  Pt. presents s/p  L4-5 TLIF and with increased pain. Pt. will benefit from skilled OT to increase functional independence and get ot to supervision level at D/C home.    OT Assessment  Patient needs continued OT Services    Follow Up Recommendations  No OT follow up;Supervision - Intermittent    Barriers to Discharge None    Equipment Recommendations  3-in-1 bedside commode       Frequency  Min 2X/week    Precautions / Restrictions Precautions Precautions: Back Precaution Booklet Issued: Yes (comment) Restrictions Weight Bearing Restrictions: No   Pertinent Vitals/Pain 4/10 back    ADL  Eating/Feeding: Simulated;Independent Where Assessed - Eating/Feeding: Chair Grooming: Performed;Wash/dry face;Set up;Min guard Where Assessed - Grooming: Unsupported standing Upper Body Bathing: Simulated;Set up Where Assessed - Upper Body Bathing: Supported sitting Lower Body Bathing: Simulated;Maximal assistance Where Assessed - Lower Body Bathing: Unsupported sit to stand Upper Body Dressing: Performed;Minimal assistance Where Assessed - Upper Body Dressing: Supported sitting Lower Body Dressing: Simulated;Maximal assistance Where Assessed - Lower Body Dressing: Unsupported sitting Toilet Transfer: Simulated;Minimal assistance Toilet Transfer Method: Sit to stand Toilet Transfer Equipment: Other (comment) (straight back chair) Toileting - Clothing Manipulation and Hygiene: Simulated;Minimal assistance Where Assessed - Toileting Clothing Manipulation and Hygiene: Sit to stand from 3-in-1 or toilet Equipment Used: Rolling walker Transfers/Ambulation Related to ADLs: Pt. min assist ~100' with RW ADL Comments: Pt. educated on 3/3 back precautions with  ADLs and techniques for completing ADLs while maintaining precautions. Pt. unable to cross foot over opposite knee and will benefit from education on use of AE with LB dressing/bathing.     OT Diagnosis: Acute pain  OT Problem List: Decreased activity tolerance;Impaired balance (sitting and/or standing);Decreased knowledge of use of DME or AE;Decreased knowledge of precautions;Pain OT Treatment Interventions: Self-care/ADL training;DME and/or AE instruction;Therapeutic activities;Patient/family education;Balance training   OT Goals Acute Rehab OT Goals OT Goal Formulation: With patient Time For Goal Achievement: 12/13/11 Potential to Achieve Goals: Good ADL Goals Pt Will Perform Lower Body Dressing: with set-up;with supervision;with adaptive equipment;Unsupported;Sit to stand from bed ADL Goal: Lower Body Dressing - Progress: Goal set today Pt Will Transfer to Toilet: with supervision;with DME;Ambulation;3-in-1 ADL Goal: Toilet Transfer - Progress: Goal set today Pt Will Perform Tub/Shower Transfer: Shower transfer;with supervision;with DME;Ambulation;Shower seat with back ADL Goal: Web designer - Progress: Goal set today Additional ADL Goal #1: Pt. will recall 3 back precautions ADL Goal: Additional Goal #1 - Progress: Goal set today  Visit Information  Last OT Received On: 12/06/11 Assistance Needed: +1 PT/OT Co-Evaluation/Treatment: Yes    Subjective Data  Subjective: "Oh I am ready to get up" Patient Stated Goal: "I need to move"   Prior Functioning  Home Living Lives With: Spouse Available Help at Discharge: Family;Available 24 hours/day Type of Home: House Home Access: Stairs to enter [employer] of Steps: 1 Entrance Stairs-Rails: None Home Layout: One level Bathroom Shower/Tub: Health visitor: Handicapped height Bathroom Accessibility: Yes Home Adaptive Equipment: Straight cane Additional Comments: Can borrow a RW from a  friend Prior Function Level of Independence: Independent Able to Take Stairs?: Yes Driving: Yes Vocation: Retired Musician: No difficulties    Cognition  Overall Cognitive Status: Appears within functional limits for tasks assessed/performed Arousal/Alertness: Awake/alert Orientation Level: Oriented X4 / Intact Behavior  During Session: [facility] for tasks performed    Extremity/Trunk Assessment Right Lower Extremity Assessment RLE ROM/Strength/Tone: [facility] for tasks assessed RLE Sensation: [facility] - Light Touch Left Lower Extremity Assessment LLE ROM/Strength/Tone: WFL for tasks assessed LLE Sensation: WFL - Light Touch   Mobility Bed Mobility Bed Mobility: Rolling Right;Right Sidelying to Sit;Sitting - Scoot to [place] of Bed Rolling Right: 4: Min assist Right Sidelying to Sit: 4: Min assist Sitting - Scoot to Edge of Bed: 5: Supervision Details for Bed Mobility Assistance: cues for log rolling, back precautions and encouragement Transfers Transfers: Sit to Stand;Stand to Sit Sit to Stand: 4: Min assist;With upper extremity assist;From elevated surface;From bed Stand to Sit: 4: Min guard;With upper extremity assist;With armrests;To chair/3-in-1 Details for Transfer Assistance: cues for use of UEs, anterior wt shift with sit to stand, back precautions, and controlling descent to chair.           End of Session OT - End of Session Equipment Utilized During Treatment: Gait belt Activity Tolerance: Patient tolerated treatment well Patient left: in chair;with call bell/phone within reach Nurse Communication: Mobility status   Cassandria Anger, OTR/L Pager [phone] 12/06/2011, 9:32 AM

## 2011-12-06 NOTE — Progress Notes (Signed)
Foley cath dc'd this AM at 0600 by Rella Larve, RN, per order.

## 2011-12-06 NOTE — Evaluation (Signed)
Physical Therapy Evaluation Patient Details Name: [name] MRN: [id number] DOB: 1940-10-27 Today's Date: 12/06/2011 Time: 4098-1191 PT Time Calculation (min): 25 min  PT Assessment / Plan / Recommendation Clinical Impression  pt presents s/p L4-5 TLIF.  pt very motivated and moving well.  Anticipate will make great progress.  pt will need HHPT at D/C.      PT Assessment  Patient needs continued PT services    Follow Up Recommendations  Home health PT;Supervision - Intermittent    Barriers to Discharge None      lEquipment Recommendations  None recommended by PT    Recommendations for Other Services     Frequency Min 5X/week    Precautions / Restrictions Precautions Precautions: Back Precaution Booklet Issued: Yes (comment) Restrictions Weight Bearing Restrictions: No   Pertinent Vitals/Pain Pain 3-4/10 during mobility      Mobility  Bed Mobility Bed Mobility: Rolling Right;Right Sidelying to Sit;Sitting - Scoot to [place] of Bed Rolling Right: 4: Min assist Right Sidelying to Sit: 4: Min assist Sitting - Scoot to Edge of Bed: 5: Supervision Details for Bed Mobility Assistance: cues for log rolling, back precautions and encouragement Transfers Transfers: Sit to Stand;Stand to Sit Sit to Stand: 4: Min assist;With upper extremity assist;From elevated surface;From bed Stand to Sit: 4: Min guard;With upper extremity assist;With armrests;To chair/3-in-1 Details for Transfer Assistance: cues for use of UEs, anterior wt shift with sit to stand, back precautions, and controlling descent to chair.   Ambulation/Gait Ambulation/Gait Assistance: 4: Min guard Ambulation Distance (Feet): 100 Feet Assistive device: Rolling walker Ambulation/Gait Assistance Details: cues for upright posture, positioning in RW, back precautions.   Gait Pattern: Step-through pattern;Decreased stride length;Trunk flexed Stairs: No Wheelchair Mobility Wheelchair Mobility: No    Exercises      PT Diagnosis: Difficulty walking;Acute pain  PT Problem List: Decreased activity tolerance;Decreased balance;Decreased mobility;Decreased knowledge of use of DME;Decreased knowledge of precautions;Pain PT Treatment Interventions: DME instruction;Gait training;Stair training;Functional mobility training;Therapeutic activities;Therapeutic exercise;Balance training;Patient/family education   PT Goals Acute Rehab PT Goals PT Goal Formulation: With patient Time For Goal Achievement: 12/13/11 Potential to Achieve Goals: Good Pt will Roll Supine to Right Side: Independently PT Goal: Rolling Supine to Right Side - Progress: Goal set today Pt will Roll Supine to Left Side: Independently PT Goal: Rolling Supine to Left Side - Progress: Goal set today Pt will go Supine/Side to Sit: Independently PT Goal: Supine/Side to Sit - Progress: Goal set today Pt will go Sit to Supine/Side: Independently PT Goal: Sit to Supine/Side - Progress: Goal set today Pt will go Sit to Stand: with modified independence PT Goal: Sit to Stand - Progress: Goal set today Pt will go Stand to Sit: with modified independence PT Goal: Stand to Sit - Progress: Goal set today Pt will Ambulate: >150 feet;with modified independence;with rolling walker PT Goal: Ambulate - Progress: Goal set today Pt will Go Up / Down Stairs: 1-2 stairs;with supervision;with least restrictive assistive device PT Goal: Up/Down Stairs - Progress: Goal set today Additional Goals Additional Goal #1: pt will verbalize and follow back precautions.   PT Goal: Additional Goal #1 - Progress: Goal set today  Visit Information  Last PT Received On: 12/06/11 Assistance Needed: +1 PT/OT Co-Evaluation/Treatment: Yes    Subjective Data  Subjective: I normally workout quite often.   Patient Stated Goal: Back to being active   Prior Functioning  Home Living Lives With: Spouse Available Help at Discharge: Family;Available 24 hours/day Type of Home:  House Home  Access: Stairs to enter [employer] of Steps: 1 Entrance Stairs-Rails: None Home Layout: One level Bathroom Shower/Tub: Health visitor: Handicapped height Bathroom Accessibility: Yes Home Adaptive Equipment: Straight cane Additional Comments: Can borrow a RW from a friend Prior Function Level of Independence: Independent Able to Take Stairs?: Yes Driving: Yes Vocation: Retired Musician: No difficulties    Cognition  Overall Cognitive Status: Appears within functional limits for tasks assessed/performed Arousal/Alertness: Awake/alert Orientation Level: Oriented X4 / Intact Behavior During Session: [facility] for tasks performed    Extremity/Trunk Assessment Right Lower Extremity Assessment RLE ROM/Strength/Tone: WFL for tasks assessed RLE Sensation: WFL - Light Touch Left Lower Extremity Assessment LLE ROM/Strength/Tone: WFL for tasks assessed LLE Sensation: WFL - Light Touch   Balance Balance Balance Assessed: No  End of Session PT - End of Session Equipment Utilized During Treatment: Gait belt Activity Tolerance: Patient tolerated treatment well Patient left: in chair;with call bell/phone within reach Nurse Communication: Mobility status   Sunny Schlein, [place] [phone] 12/06/2011, 9:18 AM

## 2011-12-06 NOTE — Op Note (Signed)
NAMEKHYAN, OATS NO.:  [id number]  MEDICAL RECORD NO.:  [id number]  LOCATION:  5019                         FACILITY:  [facility]  PHYSICIAN:  Estill Bamberg, MD      DATE OF BIRTH:  1940/10/05  DATE OF PROCEDURE:  12/05/2011                              OPERATIVE REPORT   PREOPERATIVE DIAGNOSES: 1. L4-5 spondylolisthesis. 2. Left-sided L4 radiculopathy. 3. 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  POSTOPERATIVE DIAGNOSES: 1. L4-5 spondylolisthesis. 2. Left-sided L4 radiculopathy. 3. 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  PROCEDURES: 1. Left-sided L4-5 transforaminal lumbar interbody fusion. 2. Right-sided posterolateral fusion. 3. Placement of posterior instrumentation at L4, L5, (7 x 45 mm screws     x4). 4. Insertion of interbody device x1 (13 x 27 mm CONCORDE bullet cage). 5. Use of local autograft. 6. Bone marrow aspiration from a separate incision over the patient's     left iliac crest. 7. Intraoperative use of fluoroscopy.  SURGEON:  Estill Bamberg, MD  ASSISTANT:  Skip Mayer, PA-C  ANESTHESIA:  General endotracheal anesthesia.  COMPLICATIONS:  None.  DISPOSITION:  Stable.  ESTIMATED BLOOD LOSS:  200 mL.  INDICATIONS FOR PROCEDURE:  Briefly, Mr. Vallone is an extremely pleasant, 71 year old male, who initially presented to me on Nov 29, 2011, with severe pain in the left leg.  The patient was previously managed by Dr. Renae Fickle.  He did report a 91-month history of pain and weakness in the left leg.  A CAT scan was ultimately obtained and was notable for a foraminal/extraforaminal disk protrusion at the L4-5 level which was causing compression of the exiting L4 nerve.  Upon my evaluation of the patient, he did have severe pain and weakness in addition to numbness, and  we did, therefore, have a discussion regarding going forward with the procedure outlined above.  We did make a decision to go forward with a left-sided L4-5 transforaminal lumbar interbody fusion to remove the compression and to fuse the L4-5 level given the instability identified on his radiographs.  The patient fully understood those risks and limitations of the procedure.  Of note, the patient did have a cardiac history and he did have a preoperative evaluation by his cardiologist, who did feel it was safe to go forward with surgery.  OPERATIVE DETAILS:  On Dec 05, 2011, the patient was brought to surgery and general endotracheal anesthesia was administered.  The patient was placed prone on a flat Jackson bed with a Wilson frame.  All bony prominences were meticulously padded.  SCDs were placed and antibiotics were given.  The back was then prepped and draped in the usual sterile fashion.  I then obtained a lateral intraoperative radiograph to help identify the trajectory of the L4 and L5 pedicles.  I then made an incision from approximately spinous process of L3 to approximately spinous process of L5.  The fascia was incised at the midline, and the paraspinal musculature was bluntly swept laterally.  I then obtained an intraoperative  lateral fluoroscopic view to confirm the appropriate operative levels.  The lamina of L4 and L5 was subperiosteally exposed as were the transverse processes of L4 and L5.  The L4-5 facet joint was noted to be hypertrophic and this also was subperiosteally exposed.  I then used a 4-mm high-speed bur to cannulate the L4 and L5 pedicles, first on the right and then on the left side.  I then used a curved gearshift probe followed by a 6-mm tap.  I did use a ball-tip probe to confirm there is no cortical violation of the cannulated pedicle.  On the right side, I did place 7 x 45 mm screws and a 45-mm rod. Distraction was applied across the rod and caps were  placed.  Of note, prior to placing the screws, I did use a 4-mm bur to decorticate the posterior elements of L4 and L5 in addition to the L4-5 facet joint and in addition to the L4 and L5 transverse processes.  I then obtained bone marrow aspirate from the patient's left iliac crest using a separate incision.  Of note, 14 mL of bone marrow aspirate was harvested and this was mixed with a total of 20 mL of Vitoss BA.  The Vitoss/bone marrow aspirate mixture was placed across the posterior elements and into the posterolateral gutter on the right side.  About 50% of the mixture was utilized.  I then turned my attention towards the patient's left side. I did perform a full facetectomy, and I did skeletonize the L4 and L5 pedicles.  Of note, upon evaluating the intervertebral disk on the left side, it was obvious that there was a moderate-to-large-sized foraminal/extraforaminal disk protrusion which was causing compression of the exiting L4 nerve.  I did use a reverse angled Scoville curette to displace the herniated fragment into the intervertebral space to remove compression of the exiting L4 nerve.  I then went forward with a thorough and complete diskectomy using a series of curettes and pituitary rongeurs.  I was very pleased with the final diskectomy.  I then placed a series of trials, and I did feel that a 13-mm interbody trial would be the most appropriate fit.  The trial was then filled with autograft obtained for removing the facet joint in addition to the Vitoss mixture as previously described.  Prior to placing the trial, I did liberally pack the intervertebral space with an abundant amount of autograft and the Vitoss mixture.  I then tamped the interbody trial into the appropriate position.  I did use both the AP and lateral fluoroscopy while positioning the implant to confirm the appropriate position of the implant.  I did note an excellent press fit.  At this point, distraction  was discontinued on the contralateral right side.  I then placed 7 x 45 mm screws at L4 and L5 and a 40-mm rod was placed. Of note, I did test the screws on the left side.  Using an triggered EMG, there was no screw that tested below 20 milliamps.  I then placed a 40-mm rod and compression was applied across the rod and caps were placed and a final locking procedure was performed.  I then applied compression across the right side and again, caps were placed and final locking procedure was performed.  I was very happy with the final construct on both the AP and lateral views.  I did control all epidural bleeding using FloSeal.  There was no undue epidural bleeding identified at this point.  The  wound was then copiously irrigated using 2 L of normal saline.  Of note, retractors were relaxed approximately every 30 minutes to optimize perfusion of the soft tissues.  The fascia was then closed using #1 Vicryl and the subcutaneous layers were closed using 2-0 Vicryl.  The skin was closed using 4-0 Monocryl.  All instrument counts were correct at the termination of the procedure.  Of note, neurologic monitoring was used throughout the procedure and there were no abnormal findings noted throughout the surgery.  Of note, Bomani Oommen was my assistant throughout the procedure and aided in essential retraction and suctioning required throughout the surgery.     Estill Bamberg, MD     MD/MEDQ  D:  12/05/2011  T:  12/06/2011  Job:  [id number]  cc:   Dr. Milly Jakob Elam Dutch

## 2011-12-07 DIAGNOSIS — M5417 Radiculopathy, lumbosacral region: Secondary | ICD-10-CM | POA: Diagnosis present

## 2011-12-07 NOTE — Progress Notes (Signed)
Occupational Therapy Treatment Patient Details Name: SHAW DOBEK MRN: [id number] DOB: 12-Nov-1940 Today's Date: 12/07/2011 Time: 0940-1000 OT Time Calculation (min): 20 min  OT Assessment / Plan / Recommendation Comments on Treatment Session Pt. progressing very well today and anticipates D/C home.     Follow Up Recommendations  No OT follow up;Supervision - Intermittent    Barriers to Discharge       Equipment Recommendations  3 in 1 bedside comode    Recommendations for Other Services    Frequency Min 2X/week   Plan Discharge plan remains appropriate    Precautions / Restrictions Precautions Precautions: Back Precaution Booklet Issued: Yes (comment) Restrictions Weight Bearing Restrictions: No   Pertinent Vitals/Pain 2/10 in back    ADL  Grooming: Performed;Wash/dry hands;Wash/dry face;Set up;Supervision/safety Where Assessed - Grooming: Unsupported standing Lower Body Bathing: Simulated;Set up Where Assessed - Lower Body Bathing: Unsupported sit to stand Lower Body Dressing: Performed;Set up;Supervision/safety Where Assessed - Lower Body Dressing: Unsupported sitting Tub/Shower Transfer: Simulated;Minimal assistance Tub/Shower Transfer Method: Ambulating Tub/Shower Transfer Equipment: Shower seat with back;Walk in shower Equipment Used: Rolling walker Transfers/Ambulation Related to ADLs: Pt. supervision ~150' With RW ADL Comments: Pt. able to recall 3/3 back precautions and educated on techniques for completing LB ADLs with use of AE      OT Goals Acute Rehab OT Goals OT Goal Formulation: With patient Time For Goal Achievement: 12/13/11 Potential to Achieve Goals: Good ADL Goals Pt Will Perform Lower Body Dressing: with set-up;with supervision;with adaptive equipment;Unsupported;Sit to stand from bed ADL Goal: Lower Body Dressing - Progress: Met Pt Will Transfer to Toilet: with supervision;with DME;Ambulation;3-in-1 ADL Goal: Toilet Transfer - Progress:  Met Pt Will Perform Tub/Shower Transfer: Shower transfer;with supervision;with DME;Ambulation;Shower seat with back ADL Goal: Web designer - Progress: Progressing toward goals Additional ADL Goal #1: Pt. will recall 3 back precautions ADL Goal: Additional Goal #1 - Progress: Met  Visit Information  Last OT Received On: 12/07/11 Assistance Needed: +1          Cognition  Overall Cognitive Status: Appears within functional limits for tasks assessed/performed Arousal/Alertness: Awake/alert Orientation Level: Oriented X4 / Intact Behavior During Session: [facility] for tasks performed    Mobility Transfers Sit to Stand: 5: Supervision Stand to Sit: 5: Supervision Details for Transfer Assistance: Min verbal cues for hand placement          End of Session OT - End of Session Equipment Utilized During Treatment: Gait belt Activity Tolerance: Patient tolerated treatment well Patient left: in chair;with call bell/phone within reach Nurse Communication: Mobility status   Chalese Peach, oTR/L Pager [phone] 12/07/2011, 10:28 AM

## 2011-12-07 NOTE — Progress Notes (Signed)
Home health equipments at bedside, home health services arranged with [facility] .Discharge instructions reviewed with patient and his wife. Both verbalized understanding of same. 2 prescriptions given to wife. Patient d/c by wheelchair in stable conditions.

## 2011-12-07 NOTE — Progress Notes (Signed)
Physical Therapy Treatment Patient Details Name: [name] MRN: [id number] DOB: 19-Sep-1940 Today's Date: 12/07/2011 Time: 1039-1100 PT Time Calculation (min): 21 min  PT Assessment / Plan / Recommendation Comments on Treatment Session  Plan is for d/c home today with wife.    Follow Up Recommendations  Home health PT;Supervision - Intermittent    Barriers to Discharge        Equipment Recommendations  Rolling walker with 5" wheels;3 in 1 bedside comode    Recommendations for Other Services    Frequency Min 5X/week   Plan Discharge plan remains appropriate    Precautions / Restrictions Precautions Precautions: Back Precaution Booklet Issued: Yes (comment) Restrictions Weight Bearing Restrictions: No   Pertinent Vitals/Pain 3/10    Mobility  Transfers Sit to Stand: 5: Supervision;From chair/3-in-1;With armrests Stand to Sit: 5: Supervision;With armrests;To chair/3-in-1 Details for Transfer Assistance: Min verbal cues for hand placement  Ambulation/Gait Ambulation/Gait Assistance: 5: Supervision Ambulation Distance (Feet): 200 Feet Assistive device: Rolling walker Ambulation/Gait Assistance Details: verbal/tactile cues for posture Gait Pattern: Step-through pattern;Trunk flexed Stairs: Yes Stairs Assistance: 4: Min assist Stairs Assistance Details (indicate cue type and reason): verbal cues for safety/sequencing Stair Management Technique: Forwards;With walker Number of Stairs: 1     Exercises     PT Diagnosis:    PT Problem List:   PT Treatment Interventions:     PT Goals Acute Rehab PT Goals PT Goal: Rolling Supine to Right Side - Progress: Met PT Goal: Rolling Supine to Left Side - Progress: Met PT Goal: Supine/Side to Sit - Progress: Progressing toward goal PT Goal: Sit to Supine/Side - Progress: Progressing toward goal PT Goal: Sit to Stand - Progress: Progressing toward goal PT Goal: Stand to Sit - Progress: Progressing toward goal PT Goal:  Ambulate - Progress: Progressing toward goal PT Goal: Up/Down Stairs - Progress: Progressing toward goal Additional Goals PT Goal: Additional Goal #1 - Progress: Met  Visit Information  Last PT Received On: 12/07/11 Assistance Needed: +1    Subjective Data  Subjective: I am ready to walk. Patient Stated Goal: being active, independent   Cognition  Overall Cognitive Status: Appears within functional limits for tasks assessed/performed Arousal/Alertness: Awake/alert Orientation Level: Appears intact for tasks assessed Behavior During Session: [facility] for tasks performed Cognition - Other Comments: Pt independently recalled 3/3 back precautions.    Balance     End of Session PT - End of Session Equipment Utilized During Treatment: Gait belt Activity Tolerance: Patient tolerated treatment well Patient left: in chair;with call bell/phone within reach;with family/visitor present Nurse Communication: Other (comment) (home equipment needs)    Ilda Foil 12/07/2011, 12:03 PM  Aida Raider, PT  Office # [phone] Pager [phone]

## 2011-12-07 NOTE — Care Management Note (Signed)
    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     HH arranged  HH-2 PT      [facility] agency  [facility]   Status of service:  Completed, signed off Medicare Important Message given?   (If response is "NO", the following Medicare IM given date fields will be blank) Date Medicare IM given:   Date Additional Medicare IM given:    Discharge Disposition:  HOME W HOME HEALTH SERVICES  Per UR Regulation:    If discussed at Long Length of Stay Meetings, dates discussed:    Comments:  12/06/2016 1200 Gave pt choice for [facility], and pt requested [facility]. Contacted [facility] for [facility] for scheduled d/c today with HH and DME. Isidoro Donning RN CCM Case Mgmt phone [phone]

## 2011-12-07 NOTE — Discharge Summary (Signed)
Patient ID: [name] MRN: [id number] DOB/AGE: November 10, 1940 71 y.o.  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 Myocardial infarction     2005  . Glaucoma   . Dysrhythmia      dr Graciela Husbands  in [place]  . Hypertension     Surgeries: Procedure(s): POSTERIOR LUMBAR FUSION 1 LEVEL on 12/05/2011   Consultants:    Discharged Condition: Improved  Hospital Course: MAJESTIC BRISTER is an 71 y.o. male who was admitted 12/05/2011 for operative treatment of<principal problem not specified>. Patient has severe unremitting pain that affects sleep, daily activities, and work/hobbies. After pre-op clearance the patient was taken to the operating room on 12/05/2011 and underwent  Procedure(s): POSTERIOR LUMBAR FUSION 1 LEVEL.    Patient was given perioperative antibiotics: Anti-infectives     Start     Dose/Rate Route Frequency Ordered Stop   12/05/11 2200   ceFAZolin (ANCEF) IVPB 1 g/50 mL premix        1 g 100 mL/hr over 30 Minutes Intravenous Every 8 hours 12/05/11 1854 12/06/11 0631   12/04/11 1519   ceFAZolin (ANCEF) IVPB 2 g/50 mL premix  Status:  Discontinued        2 g 100 mL/hr over 30 Minutes Intravenous 60 min pre-op 12/04/11 1519 12/05/11 1819           Patient was given sequential compression devices, early ambulation, and chemoprophylaxis to prevent DVT.  Patient benefited maximally from hospital stay and there were no complications.    Recent vital signs: Patient Vitals for the past 24 hrs:  BP Temp Pulse Resp SpO2  12/07/11 0507 127/51 mmHg 98.1 F (36.7 C) 104  18  97 %  12/06/11 2130 129/47 mmHg 98.8 F (37.1 C) 107  18  96 %  12/06/11 1344 131/75 mmHg 99.7 F (37.6 C) 107  18  94 %     Recent laboratory studies: No results found for this basename:  WBC:2,HGB:2,HCT:2,PLT:2,NA:2,K:2,CL:2,CO2:2,BUN:2,CREATININE:2,GLUCOSE:2,PT:2,INR:2,CALCIUM,2: in the last 72 hours   Discharge Medications:   Medication List  As of 12/07/2011  8:42 AM   STOP taking these medications         aspirin 325 MG EC tablet      clopidogrel 75 MG tablet         TAKE these medications         amLODipine 5 MG tablet   Commonly known as: NORVASC   Take 5 mg by mouth daily.      atorvastatin 80 MG tablet   Commonly known as: LIPITOR   Take 80 mg by mouth daily.      digoxin 0.125 MG tablet   Commonly known as: LANOXIN   Take 125 mcg by mouth daily.      dorzolamide-timolol 22.3-6.8 MG/ML ophthalmic solution   Commonly known as: COSOPT   Place 1 drop into both eyes 2 (two) times daily.      losartan 100 MG tablet   Commonly known as: COZAAR   Take 100 mg by mouth daily.      mulitivitamin with minerals Tabs   Take 1 tablet by mouth daily.      nitroGLYCERIN 0.4 MG SL tablet   Commonly known as: NITROSTAT   Place 0.4 mg under the tongue every 5 (five) minutes as needed. Chest pain.      pilocarpine 4 % ophthalmic  solution   Commonly known as: PILOCAR   Place 1 drop into the left eye 4 (four) times daily.      Vitamin D 2000 UNITS Caps   Take 1 capsule by mouth daily.            Diagnostic Studies: Dg Chest 2 View  12/03/2011  *RADIOLOGY REPORT*  Clinical Data: Preoperative chest radiograph.  L4-L5 operation. Left leg pain and numbness.  CHEST - 2 VIEW  Comparison: None.  Findings: Emphysema is present.  CABG/median sternotomy.  Dual lead left subclavian cardiac pacemaker.  No airspace disease.  No effusion.  Chronic blunting of the left costophrenic angle associated with emphysema.  Cardiopericardial silhouette is unchanged.  Mediastinal contours are within normal limits.  IMPRESSION: Emphysema without acute cardiopulmonary disease.  Postoperative changes of CABG.  Original Report Authenticated By: Andreas Newport, M.D.   Dg Lumbar Spine 2-3  Views  12/05/2011  *RADIOLOGY REPORT*  Clinical Data: Back pain  DG C-ARM GT 120 MIN,LUMBAR SPINE - 2-3 VIEW  Comparison: CT lumbar spine 09/17/2011  Findings: C-arm films document L4-5 PLIF.  No adverse features visible.  IMPRESSION: As above.  Original Report Authenticated By: Elsie Stain, M.D.   Dg Lumbar Spine 1 View  12/05/2011  *RADIOLOGY REPORT*  Clinical Data: Back pain  LUMBAR SPINE - 1 VIEW  Comparison: CT lumbar spine without contrast 09/17/2011  Findings: Intraoperative film at 1300 hours demonstrates needles directed most closely at the L3 and L5 spinous processes.  IMPRESSION: As above.  Original Report Authenticated By: Elsie Stain, M.D.   Dg C-arm Gt 120 Min  12/05/2011  *RADIOLOGY REPORT*  Clinical Data: Back pain  DG C-ARM GT 120 MIN,LUMBAR SPINE - 2-3 VIEW  Comparison: CT lumbar spine 09/17/2011  Findings: C-arm films document L4-5 PLIF.  No adverse features visible.  IMPRESSION: As above.  Original Report Authenticated By: Elsie Stain, M.D.    Disposition: Final discharge disposition not confirmed  Discharge Orders    Future Orders Please Complete By Expires   Diet - low sodium heart healthy      Increase activity slowly      Walker       May shower / Bathe      Driving Restrictions      Comments:   No driving for 2 weeks.   Change dressing (specify)      Comments:   Dressing change as needed.   Call MD for:  temperature >100.4      Call MD for:  severe uncontrolled pain      Call MD for:  redness, tenderness, or signs of infection (pain, swelling, redness, odor or green/yellow discharge around incision site)            Signed: Davan Nawabi M. 12/07/2011, 8:42 AM

## 2011-12-07 NOTE — Progress Notes (Signed)
Patient ambulating well in the hallway with a walker.  Mild-moderate back pain with transition from sitting to standing.  No leg pain.  Voiding well.  Tolerating PO liquids and solids.    Dressing dry.  Intact strength with dorsiflexion and plantar flexion of both feet.  A/P: s/p L4-5 fusion  D/C home today.  F/U with Dr. Yevette Edwards as scheduled.  Post-op instructions and scripts on chart.

## 2011-12-09 MED FILL — Sodium Chloride IV Soln 0.9%: INTRAVENOUS | Qty: 1000 | Status: AC

## 2011-12-09 MED FILL — Sodium Chloride Irrigation Soln 0.9%: Qty: 3000 | Status: AC

## 2011-12-11 ENCOUNTER — Encounter (HOSPITAL_COMMUNITY): Payer: Self-pay

## 2012-09-01 DIAGNOSIS — I4891 Unspecified atrial fibrillation: Secondary | ICD-10-CM | POA: Diagnosis present

## 2021-01-15 ENCOUNTER — Encounter (HOSPITAL_COMMUNITY): Payer: Self-pay | Admitting: *Deleted

## 2021-01-15 ENCOUNTER — Emergency Department (HOSPITAL_COMMUNITY): Payer: Medicare Other

## 2021-01-15 ENCOUNTER — Emergency Department (HOSPITAL_COMMUNITY)
Admission: EM | Admit: 2021-01-15 | Discharge: 2021-01-15 | Disposition: A | Discharge status: Home / Self Care | Payer: Medicare Other | Attending: Emergency Medicine | Admitting: Emergency Medicine

## 2021-01-15 ENCOUNTER — Other Ambulatory Visit: Payer: Self-pay

## 2021-01-15 DIAGNOSIS — Z79899 Other long term (current) drug therapy: Secondary | ICD-10-CM | POA: Diagnosis not present

## 2021-01-15 DIAGNOSIS — R42 Dizziness and giddiness: Secondary | ICD-10-CM | POA: Insufficient documentation

## 2021-01-15 DIAGNOSIS — Z950 Presence of cardiac pacemaker: Secondary | ICD-10-CM | POA: Diagnosis not present

## 2021-01-15 DIAGNOSIS — I10 Essential (primary) hypertension: Secondary | ICD-10-CM | POA: Diagnosis not present

## 2021-01-15 DIAGNOSIS — Z951 Presence of aortocoronary bypass graft: Secondary | ICD-10-CM | POA: Insufficient documentation

## 2021-01-15 LAB — URINALYSIS, ROUTINE W REFLEX MICROSCOPIC
Bilirubin Urine: NEGATIVE
Glucose, UA: NEGATIVE mg/dL
Hgb urine dipstick: NEGATIVE
Ketones, ur: 5 mg/dL — AB
Leukocytes,Ua: NEGATIVE
Nitrite: NEGATIVE
Protein, ur: NEGATIVE mg/dL
Specific Gravity, Urine: 1.011 (ref 1.005–1.030)
pH: 6 (ref 5.0–8.0)

## 2021-01-15 LAB — TROPONIN I (HIGH SENSITIVITY)
Troponin I (High Sensitivity): 11 ng/L (ref <18)
Troponin I (High Sensitivity): 13 ng/L (ref <18)
Troponin I (High Sensitivity): 13 ng/L (ref <18)
Troponin I (High Sensitivity): 12 ng/L (ref <18)

## 2021-01-15 LAB — CBC
HCT: 39.6 % (ref 39.0–52.0)
HCT: 44.9 % (ref 39.0–52.0)
Hemoglobin: 13.1 g/dL (ref 13.0–17.0)
Hemoglobin: 14.9 g/dL (ref 13.0–17.0)
MCH: 34.8 pg — ABNORMAL HIGH (ref 26.0–34.0)
MCH: 30.9 pg (ref 26.0–34.0)
MCHC: 33.1 g/dL (ref 30.0–36.0)
MCHC: 33.2 g/dL (ref 30.0–36.0)
MCV: 105.3 fL — ABNORMAL HIGH (ref 80.0–100.0)
MCV: 93.2 fL (ref 80.0–100.0)
Platelets: 138 10*3/uL — ABNORMAL LOW (ref 150–400)
Platelets: 231 10*3/uL (ref 150–400)
RBC: 3.76 MIL/uL — ABNORMAL LOW (ref 4.22–5.81)
RBC: 4.82 MIL/uL (ref 4.22–5.81)
RDW: 12.2 % (ref 11.5–15.5)
RDW: 13.2 % (ref 11.5–15.5)
WBC: 5.8 10*3/uL (ref 4.0–10.5)
WBC: 10.5 10*3/uL (ref 4.0–10.5)
nRBC: 0 % (ref 0.0–0.2)
nRBC: 0 % (ref 0.0–0.2)

## 2021-01-15 LAB — BASIC METABOLIC PANEL
Anion gap: 6 (ref 5–15)
Anion gap: 8 (ref 5–15)
BUN: 8 mg/dL (ref 8–23)
BUN: 12 mg/dL (ref 8–23)
CO2: 25 mmol/L (ref 22–32)
CO2: 27 mmol/L (ref 22–32)
Calcium: 9 mg/dL (ref 8.9–10.3)
Calcium: 8.9 mg/dL (ref 8.9–10.3)
Chloride: 109 mmol/L (ref 98–111)
Chloride: 98 mmol/L (ref 98–111)
Creatinine, Ser: 0.7 mg/dL (ref 0.61–1.24)
Creatinine, Ser: 0.77 mg/dL (ref 0.61–1.24)
GFR, Estimated: >60 mL/min (ref >60)
GFR, Estimated: >60 mL/min (ref >60)
Glucose, Bld: 171 mg/dL — ABNORMAL HIGH (ref 70–99)
Glucose, Bld: 132 mg/dL — ABNORMAL HIGH (ref 70–99)
Potassium: 3.7 mmol/L (ref 3.5–5.1)
Potassium: 3.9 mmol/L (ref 3.5–5.1)
Sodium: 140 mmol/L (ref 135–145)
Sodium: 133 mmol/L — ABNORMAL LOW (ref 135–145)

## 2021-01-15 LAB — CBG MONITORING, ED: Glucose-Capillary: 137 mg/dL — ABNORMAL HIGH (ref 70–99)

## 2021-01-15 MED ORDER — MECLIZINE HCL 25 MG PO TABS: 25.0000 mg | ORAL_TABLET | Freq: Three times a day (TID) | ORAL | 0 refills | Status: DC | PRN

## 2021-01-15 NOTE — ED Provider Notes (Signed)
Emergency Medicine Provider Triage Evaluation Note  [name] , a 80 y.o. male  was evaluated in triage.  Pt complains of dizziness.  Pt reports he has been seen several times and started on meclizine.  Pt reports he went to Pt with no improvement.   Review of Systems  Positive: Sinus congestion Negative: Fever or cough  Physical Exam  BP (!) 162/67 (BP Location: Right Arm)   Pulse 63   Temp 97.7 F (36.5 C) (Oral)   Resp 18   Ht 5\' 11"  (1.803 m)   Wt 68 kg   SpO2 97%   BMI 20.92 kg/m  Gen:   Awake, no distress   Resp:  Normal effort  MSK:   Moves extremities without difficulty  Other:    Medical Decision Making  Medically screening exam initiated at 5:03 PM.  Appropriate orders placed.  was informed that the remainder of the evaluation will be completed by another provider, this initial triage assessment does not replace that evaluation, and the importance of remaining in the ED until their evaluation is complete.     Mickle Mallory, [place] Areas 01/15/21 1713    01/17/21, MD 01/15/21 2104

## 2021-01-15 NOTE — ED Notes (Signed)
Pt to er room number 12, pt states that he is here for dizziness, states that he took a meclizine and is starting to feel better, states that he got pt for his vertigo, states that he has had cataracts and his pupils are odd shaped and fixed at baseline.  Pt oriented times three, pt moving all extremities.

## 2021-01-15 NOTE — ED Triage Notes (Signed)
Dizzy with history of vertigo

## 2021-01-15 NOTE — ED Notes (Signed)
Pt sitting up in bed, sig other at bedside, pt denies pain, pt states that he just has a little bit of dizziness.

## 2021-01-15 NOTE — Discharge Instructions (Addendum)
Make an appointment one of the 2 ear nose and throat doctors listed above.  Renewed your prescription for your Antivert.

## 2021-01-15 NOTE — ED Provider Notes (Signed)
[facility] EMERGENCY DEPARTMENT Provider Note   CSN: [id number] Arrival date & time: 01/15/21  1541     History Chief Complaint  Patient presents with   Dizziness    [name] is a 80 y.o. male.  Patient has been struggling with vertigo on and off since May.  Has seen neurology in [place].  Has seen his doctors including cardiology in [place].  Neurology referred him to physical therapy.  Physical therapist thinks that perhaps may be there is something going on with his ears.  Patient has a pacemaker so cannot have an MRI.  Patient thinks that maybe he has a sinus infection he was referred to ear nose and throat in the [place] area but they cannot see him for 5 weeks.  So he is looking for a quicker referral to ear nose and throat.  He takes his Antivert it does help.  No new or worse symptoms.  Patient gets an attack it is true room spinning.  Patient denies any ear pain or any hearing changes.  He can get nausea.  Past medical history is significant for dysrhythmia hypertension myocardial infarction in 2005.  Coronary artery bypass graft in 2005.  And has pacemaker.  Patient without any symptoms currently, but did have some earlier today.      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  Social History   Tobacco Use   Smoking status: Never   Smokeless tobacco: Never  Vaping Use   Vaping Use: Never used  Substance Use Topics   Alcohol use: No   Drug use: No    Home Medications Prior to Admission medications   Medication Sig Start  Date End Date Taking? Authorizing Provider  meclizine (ANTIVERT) 25 MG tablet Take 1 tablet (25 mg total) by mouth 3 (three) times daily as needed for dizziness. 01/15/21  Yes Vanetta Mulders, MD  amLODipine (NORVASC) 5 MG tablet Take 5 mg by mouth daily.    [provider]  atorvastatin (LIPITOR) 80 MG tablet Take 80 mg by mouth daily.    [provider]  Cholecalciferol (VITAMIN D) 2000 UNITS CAPS Take 1 capsule by mouth daily.    [provider]  digoxin (LANOXIN) 0.125 MG tablet Take 125 mcg by mouth daily.    [provider]  dorzolamide-timolol (COSOPT) 22.3-6.8 MG/ML ophthalmic solution Place 1 drop into both eyes 2 (two) times daily.    [provider]  losartan (COZAAR) 100 MG tablet Take 100 mg by mouth daily.    [provider]  Multiple Vitamin (MULITIVITAMIN WITH MINERALS) TABS Take 1 tablet by mouth daily.    [provider]  nitroGLYCERIN (NITROSTAT) 0.4 MG SL tablet Place 0.4 mg under the tongue every 5 (five) minutes as needed. Chest pain.    [provider]  pilocarpine (PILOCAR) 4 % ophthalmic solution Place 1 drop into the  left eye 4 (four) times daily.    [provider]    Allergies    Patient has no known allergies.  Review of Systems   Review of Systems  Constitutional:  Negative for chills and fever.  HENT:  Negative for ear pain, hearing loss and sore throat.   Eyes:  Negative for photophobia, pain and visual disturbance.  Respiratory:  Negative for cough and shortness of breath.   Cardiovascular:  Negative for chest pain and palpitations.  Gastrointestinal:  Positive for nausea. Negative for abdominal pain and vomiting.  Genitourinary:  Negative for dysuria and hematuria.  Musculoskeletal:  Negative for arthralgias and back pain.  Skin:  Negative for color change and rash.  Neurological:  Positive for dizziness. Negative for seizures, syncope, facial asymmetry, speech difficulty,  weakness, numbness and headaches.  All other systems reviewed and are negative.  Physical Exam Updated Vital Signs BP 115/62   Pulse 69   Temp 98 F (36.7 C) (Oral)   Resp 16   Ht 1.803 m (5\' 11" )   Wt 68 kg   SpO2 99%   BMI 20.92 kg/m   Physical Exam Vitals and nursing note reviewed.  Constitutional:      General: He is not in acute distress.    Appearance: Normal appearance. He is well-developed.  HENT:     Head: Normocephalic and atraumatic.     Right Ear: Tympanic membrane and ear canal normal.     Left Ear: Tympanic membrane and ear canal normal.  Eyes:     Extraocular Movements: Extraocular movements intact.     Conjunctiva/sclera: Conjunctivae normal.     Pupils: Pupils are equal, round, and reactive to light.  Cardiovascular:     Rate and Rhythm: Normal rate and regular rhythm.     Heart sounds: No murmur heard. Pulmonary:     Effort: Pulmonary effort is normal. No respiratory distress.     Breath sounds: Normal breath sounds.  Abdominal:     Palpations: Abdomen is soft.     Tenderness: There is no abdominal tenderness.  Musculoskeletal:     Cervical back: Neck supple. No rigidity.  Skin:    General: Skin is warm and dry.  Neurological:     General: No focal deficit present.     Mental Status: He is alert and oriented to person, place, and time.     Cranial Nerves: No cranial nerve deficit.     Sensory: No sensory deficit.     Motor: No weakness.    ED Results / Procedures / Treatments   Labs (all labs ordered are listed, but only abnormal results are displayed) Labs Reviewed  BASIC METABOLIC PANEL - Abnormal; Notable for the following components:      Result Value   Glucose, Bld 171 (*)    All other components within normal limits  CBC - Abnormal; Notable for the following components:   RBC 3.76 (*)    MCV 105.3 (*)    MCH 34.8 (*)    Platelets 138 (*)    All other components within normal limits  URINALYSIS, ROUTINE W REFLEX MICROSCOPIC -  Abnormal; Notable for the following components:   Ketones, ur 5 (*)    All other components within normal limits  BASIC METABOLIC PANEL - Abnormal; Notable for the following components:   Sodium 133 (*)    Glucose, Bld 132 (*)    All other components within normal limits  CBG MONITORING, ED - Abnormal; Notable for the  following components:   Glucose-Capillary 137 (*)    All other components within normal limits  CBC  TROPONIN I (HIGH SENSITIVITY)  TROPONIN I (HIGH SENSITIVITY)  TROPONIN I (HIGH SENSITIVITY)  TROPONIN I (HIGH SENSITIVITY)    EKG EKG Interpretation  Date/Time:  Monday January 15 2021 15:51:09 EDT Ventricular Rate:  62 PR Interval:  234 QRS Duration: 112 QT Interval:  386 QTC Calculation: 391 R Axis:   80 Text Interpretation: Atrial-paced rhythm with prolonged AV conduction Cannot rule out Inferior infarct , age undetermined Cannot rule out Anterior infarct , age undetermined ST & T wave abnormality, consider lateral ischemia Abnormal ECG Confirmed by Vanetta Mulders [phone]) on 01/15/2021 9:01:58 PM  Radiology CT Head Wo Contrast  Result Date: 01/15/2021 CLINICAL DATA:  Dizziness. EXAM: CT HEAD WITHOUT CONTRAST TECHNIQUE: Contiguous axial images were obtained from the base of the skull through the vertex without intravenous contrast. COMPARISON:  None. FINDINGS: Brain: There is mild cerebral atrophy with widening of the extra-axial spaces and ventricular dilatation. There are areas of decreased attenuation within the white matter tracts of the supratentorial brain, consistent with microvascular disease changes. Vascular: No hyperdense vessel or unexpected calcification. Skull: Normal. Negative for fracture or focal lesion. Sinuses/Orbits: No acute finding. Other: None. IMPRESSION: 1. Generalized cerebral atrophy. 2. No acute intracranial abnormality. Electronically Signed   By: Aram Candela M.D.   On: 01/15/2021 18:32   CT Maxillofacial Wo Contrast  Result Date:  01/15/2021 CLINICAL DATA:  Facial pain. EXAM: CT MAXILLOFACIAL WITHOUT CONTRAST TECHNIQUE: Multidetector CT imaging of the maxillofacial structures was performed. Multiplanar CT image reconstructions were also generated. COMPARISON:  None. FINDINGS: Osseous: No fracture or mandibular dislocation. No destructive process. Orbits: Negative. No traumatic or inflammatory finding. Sinuses: Clear. Soft tissues: Negative. Limited intracranial: No significant or unexpected finding. IMPRESSION: Unremarkable maxillofacial CT. Electronically Signed   By: Aram Candela M.D.   On: 01/15/2021 18:35    Procedures Procedures   Medications Ordered in ED Medications - No data to display  ED Course  I have reviewed the triage vital signs and the nursing notes.  Pertinent labs & imaging results that were available during my care of the patient were reviewed by me and considered in my medical decision making (see chart for details).    MDM Rules/Calculators/A&P                          Patient's work-up here troponins x2 negative.  Patient denied any chest pain to me.  Urinalysis negative basic metabolic panel without any acute findings.  CBC without leukocytosis.  No significant anemia.  CT head and CT max facial without any acute abnormalities.  No evidence of any sinus infection.  Patient will give a renewal on his Antivert.  Referral to ear nose and throat. Final Clinical Impression(s) / ED Diagnoses Final diagnoses:  Vertigo    Rx / DC Orders ED Discharge Orders          Ordered    meclizine (ANTIVERT) 25 MG tablet  3 times daily PRN        01/15/21 2320             Vanetta Mulders, MD 01/15/21 2340

## 2021-08-14 IMAGING — CT CT HEAD W/O CM
4 series · 17 of 47 positions shown, 19 images · non-contrast
Comparison: None.

CLINICAL DATA: Dizziness.

EXAM:
CT HEAD WITHOUT CONTRAST
TECHNIQUE: Contiguous axial images were obtained from the base of the skull
through the vertex without intravenous contrast.

[Series 2: head w o · axial · 0.54mm/px · z∈[+1684,+1804]mm · 7 of 33 slices shown, 9 images]
[im 5/33  brain]
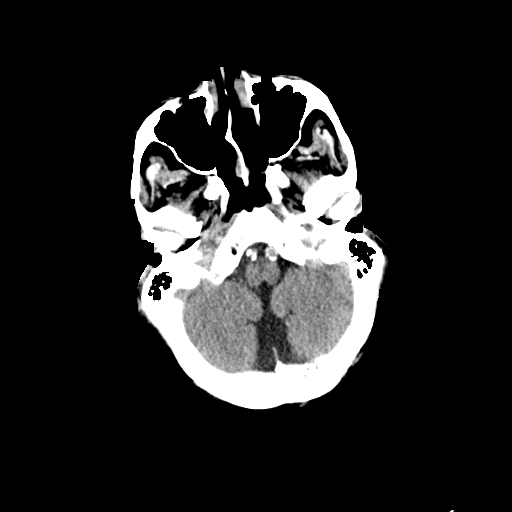
[im 5/33  bone]
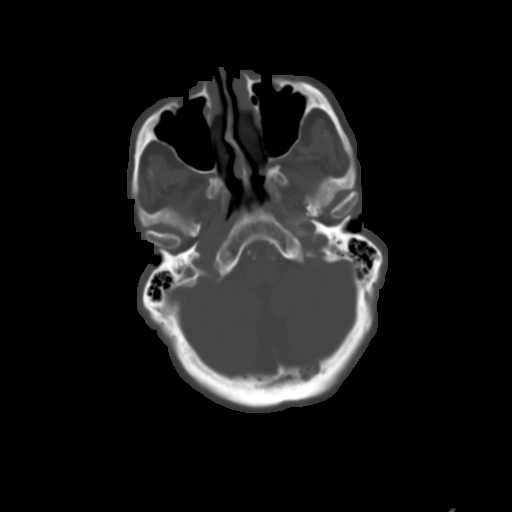
[im 9/33  brain]
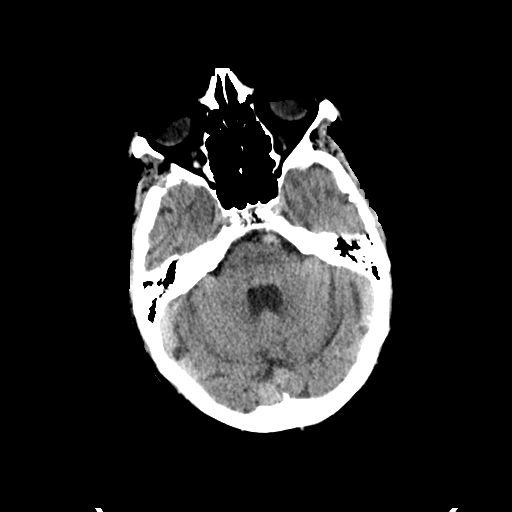
[im 13/33  brain]
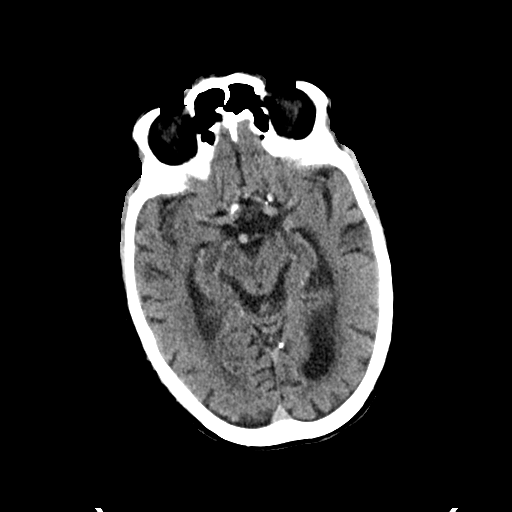
[im 17/33  brain]
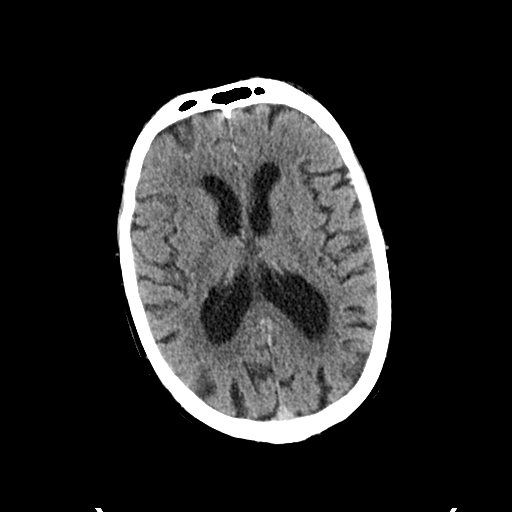
[im 21/33  brain]
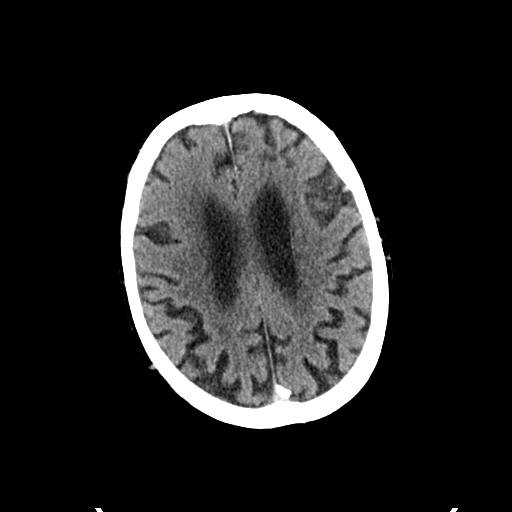
[im 21/33  bone]
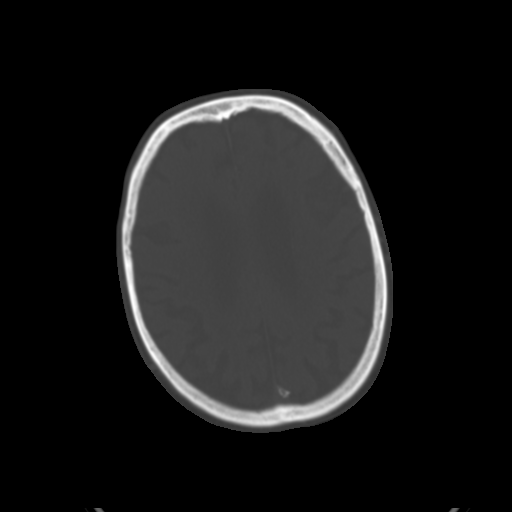
[im 25/33  brain]
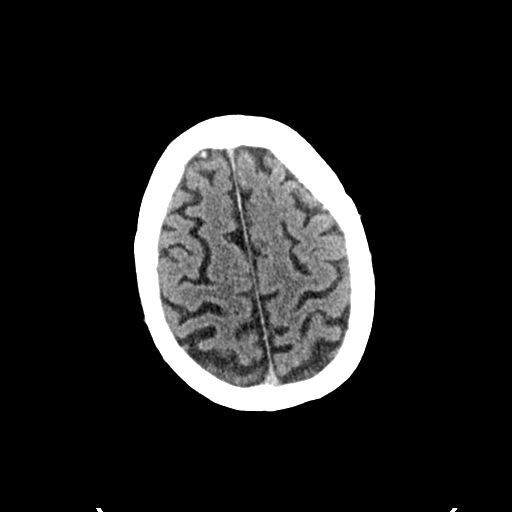
[im 29/33  brain]
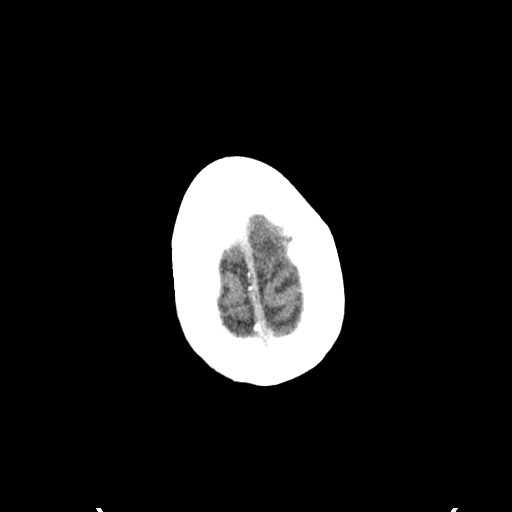

[Series 3: head bone · axial · 0.54mm/px · z∈[+1680,+1736]mm · 4 of 83 slices shown]
[im 9/83  bone]
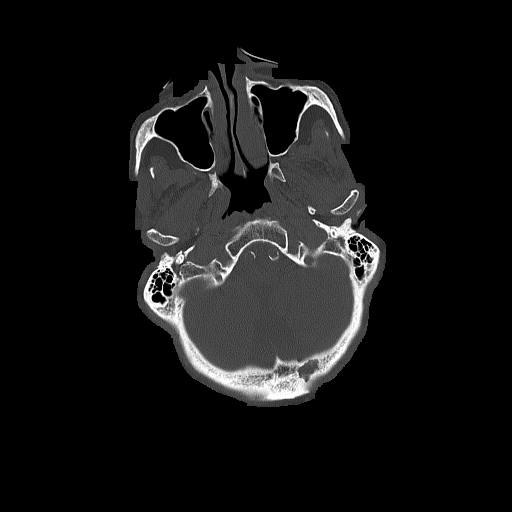
[im 17/83  bone]
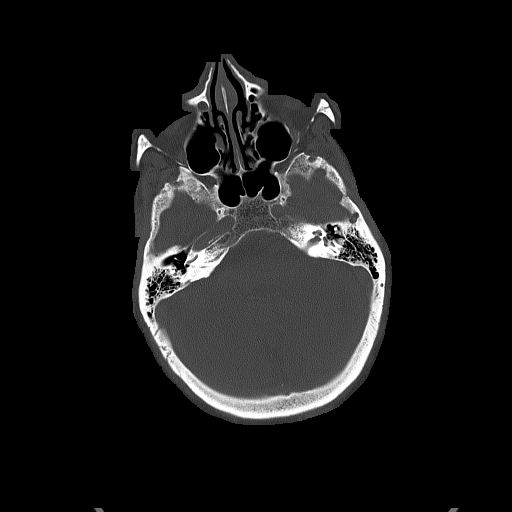
[im 25/83  bone]
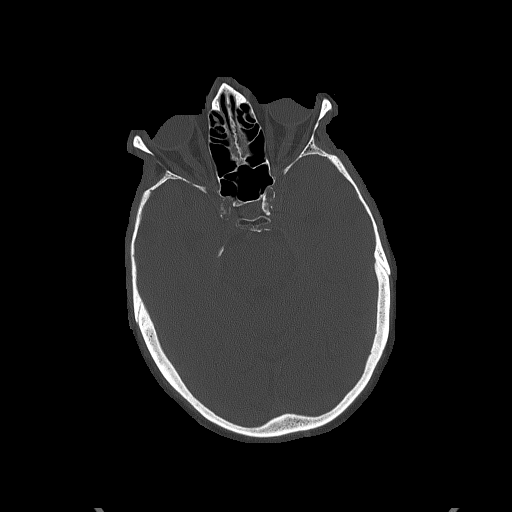
[im 37/83  bone]
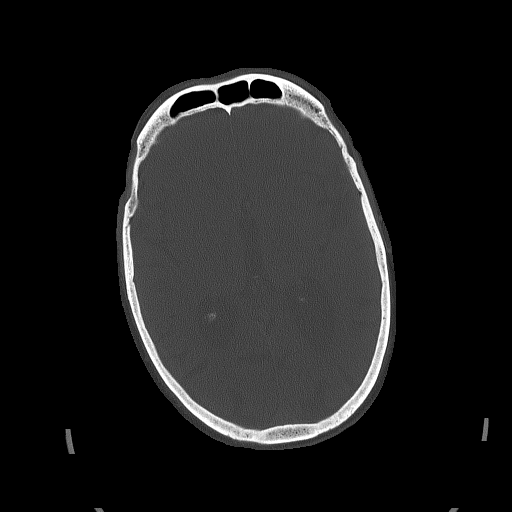

[Series 4: coronal soft · coronal · 0.35mm/px · 3 of 74 slices shown]
[im 25/74  brain]
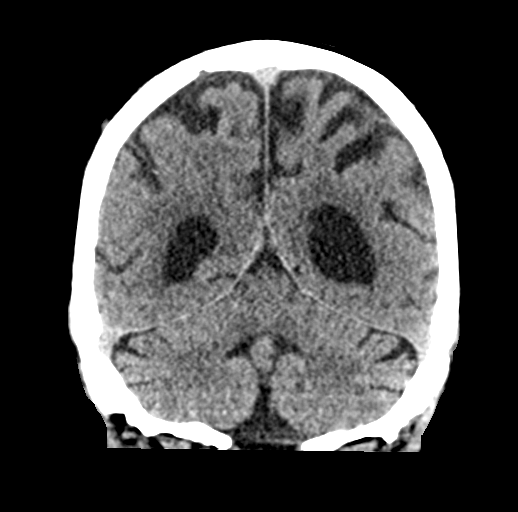
[im 33/74  brain]
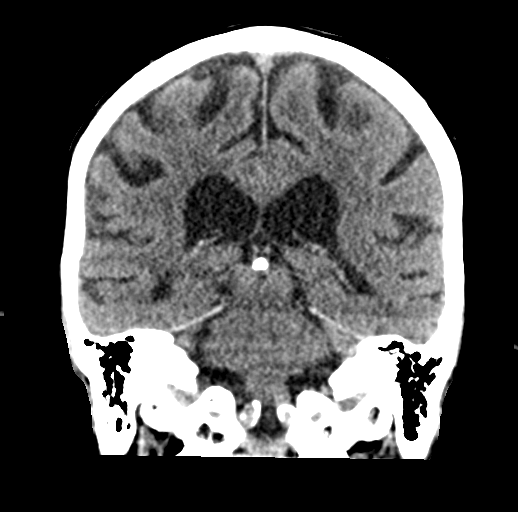
[im 41/74  brain]
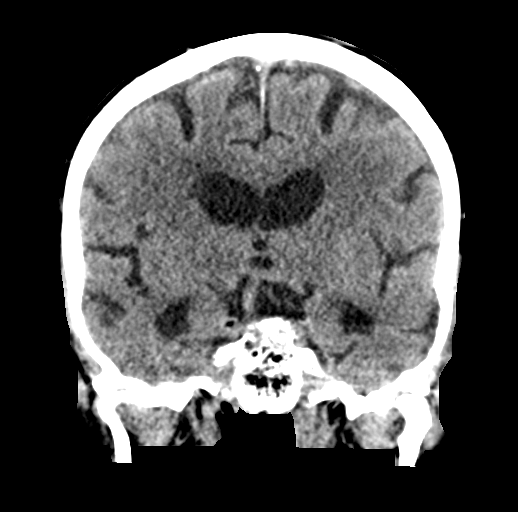

[Series 5: sagittal soft · sagittal · 0.37mm/px · 3 of 60 slices shown]
[im 20/60  brain]
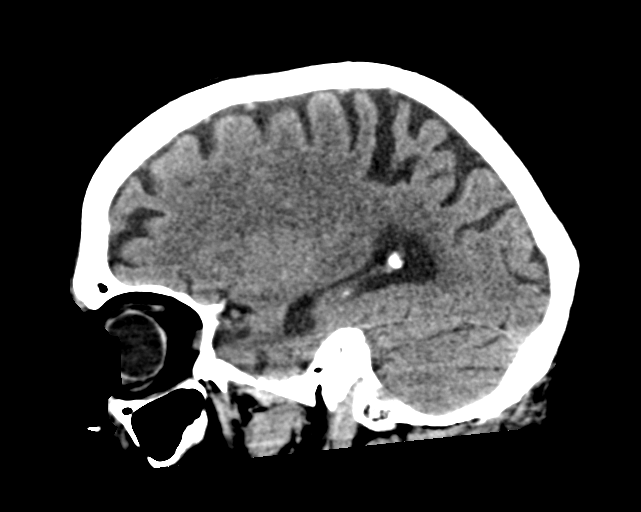
[im 30/60  brain]
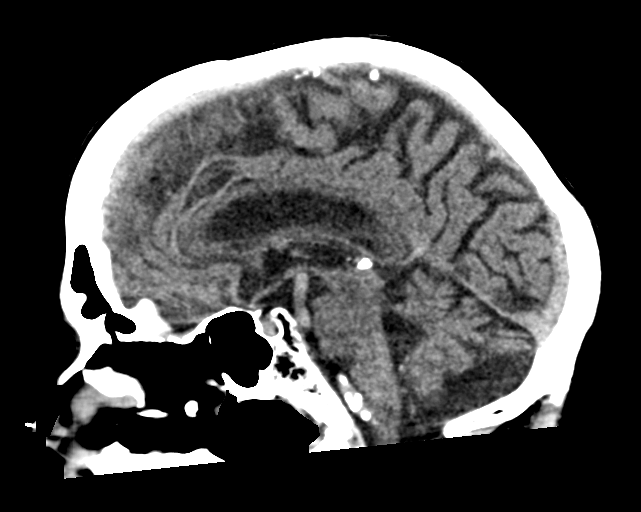
[im 40/60  brain]
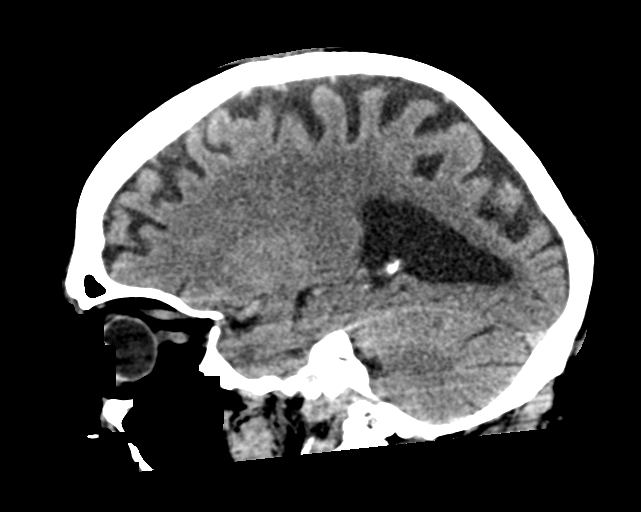

[17 of 47 positions shown; findings below may reference images not displayed]

FINDINGS: Brain: There is mild cerebral atrophy with widening of the
extra-axial spaces and ventricular dilatation.
There are areas of decreased attenuation within the white matter
tracts of the supratentorial brain, consistent with microvascular
disease changes.

Vascular: No hyperdense vessel or unexpected calcification.

Skull: Normal. Negative for fracture or focal lesion.

Sinuses/Orbits: No acute finding.

Other: None.
IMPRESSION: 1. Generalized cerebral atrophy.
2. No acute intracranial abnormality.

## 2021-10-16 DIAGNOSIS — Z7901 Long term (current) use of anticoagulants: Secondary | ICD-10-CM

## 2021-10-16 DIAGNOSIS — I351 Nonrheumatic aortic (valve) insufficiency: Secondary | ICD-10-CM | POA: Diagnosis present

## 2021-10-16 DIAGNOSIS — I2510 Atherosclerotic heart disease of native coronary artery without angina pectoris: Secondary | ICD-10-CM | POA: Diagnosis present

## 2021-10-16 DIAGNOSIS — I10 Essential (primary) hypertension: Secondary | ICD-10-CM | POA: Diagnosis present

## 2021-10-16 DIAGNOSIS — E785 Hyperlipidemia, unspecified: Secondary | ICD-10-CM | POA: Diagnosis present

## 2022-09-03 DIAGNOSIS — Z950 Presence of cardiac pacemaker: Secondary | ICD-10-CM | POA: Diagnosis present

## 2022-09-14 ENCOUNTER — Emergency Department (HOSPITAL_COMMUNITY): Payer: Medicare Other

## 2022-09-14 ENCOUNTER — Other Ambulatory Visit: Payer: Self-pay

## 2022-09-14 ENCOUNTER — Encounter (HOSPITAL_COMMUNITY): Payer: Self-pay

## 2022-09-14 ENCOUNTER — Inpatient Hospital Stay (HOSPITAL_COMMUNITY)
Admission: EM | Admit: 2022-09-14 | Discharge: 2022-09-17 | DRG: 291 | Disposition: A | Discharge status: Home / Self Care | Payer: Medicare Other | Attending: Internal Medicine | Admitting: Internal Medicine

## 2022-09-14 DIAGNOSIS — Z7901 Long term (current) use of anticoagulants: Secondary | ICD-10-CM

## 2022-09-14 DIAGNOSIS — I5023 Acute on chronic systolic (congestive) heart failure: Secondary | ICD-10-CM | POA: Diagnosis present

## 2022-09-14 DIAGNOSIS — I70202 Unspecified atherosclerosis of native arteries of extremities, left leg: Secondary | ICD-10-CM | POA: Diagnosis present

## 2022-09-14 DIAGNOSIS — Z950 Presence of cardiac pacemaker: Secondary | ICD-10-CM | POA: Diagnosis present

## 2022-09-14 DIAGNOSIS — I509 Heart failure, unspecified: Secondary | ICD-10-CM

## 2022-09-14 DIAGNOSIS — I495 Sick sinus syndrome: Secondary | ICD-10-CM | POA: Diagnosis present

## 2022-09-14 DIAGNOSIS — I10 Essential (primary) hypertension: Secondary | ICD-10-CM | POA: Diagnosis present

## 2022-09-14 DIAGNOSIS — I252 Old myocardial infarction: Secondary | ICD-10-CM | POA: Diagnosis not present

## 2022-09-14 DIAGNOSIS — M549 Dorsalgia, unspecified: Secondary | ICD-10-CM | POA: Diagnosis present

## 2022-09-14 DIAGNOSIS — H409 Unspecified glaucoma: Secondary | ICD-10-CM | POA: Diagnosis present

## 2022-09-14 DIAGNOSIS — I4892 Unspecified atrial flutter: Secondary | ICD-10-CM | POA: Diagnosis present

## 2022-09-14 DIAGNOSIS — Z1152 Encounter for screening for COVID-19: Secondary | ICD-10-CM | POA: Diagnosis not present

## 2022-09-14 DIAGNOSIS — I5041 Acute combined systolic (congestive) and diastolic (congestive) heart failure: Secondary | ICD-10-CM | POA: Diagnosis not present

## 2022-09-14 DIAGNOSIS — R791 Abnormal coagulation profile: Secondary | ICD-10-CM | POA: Diagnosis present

## 2022-09-14 DIAGNOSIS — I080 Rheumatic disorders of both mitral and aortic valves: Secondary | ICD-10-CM | POA: Diagnosis present

## 2022-09-14 DIAGNOSIS — I4891 Unspecified atrial fibrillation: Secondary | ICD-10-CM | POA: Diagnosis present

## 2022-09-14 DIAGNOSIS — I351 Nonrheumatic aortic (valve) insufficiency: Secondary | ICD-10-CM | POA: Diagnosis present

## 2022-09-14 DIAGNOSIS — I350 Nonrheumatic aortic (valve) stenosis: Secondary | ICD-10-CM | POA: Diagnosis not present

## 2022-09-14 DIAGNOSIS — I110 Hypertensive heart disease with heart failure: Principal | ICD-10-CM | POA: Diagnosis present

## 2022-09-14 DIAGNOSIS — Z79899 Other long term (current) drug therapy: Secondary | ICD-10-CM | POA: Diagnosis not present

## 2022-09-14 DIAGNOSIS — Z7982 Long term (current) use of aspirin: Secondary | ICD-10-CM

## 2022-09-14 DIAGNOSIS — I77810 Thoracic aortic ectasia: Secondary | ICD-10-CM | POA: Diagnosis present

## 2022-09-14 DIAGNOSIS — E785 Hyperlipidemia, unspecified: Secondary | ICD-10-CM | POA: Diagnosis present

## 2022-09-14 DIAGNOSIS — I2510 Atherosclerotic heart disease of native coronary artery without angina pectoris: Secondary | ICD-10-CM | POA: Diagnosis present

## 2022-09-14 DIAGNOSIS — I340 Nonrheumatic mitral (valve) insufficiency: Secondary | ICD-10-CM | POA: Diagnosis not present

## 2022-09-14 DIAGNOSIS — R0602 Shortness of breath: Secondary | ICD-10-CM | POA: Diagnosis not present

## 2022-09-14 DIAGNOSIS — I5020 Unspecified systolic (congestive) heart failure: Principal | ICD-10-CM

## 2022-09-14 DIAGNOSIS — Z951 Presence of aortocoronary bypass graft: Secondary | ICD-10-CM

## 2022-09-14 DIAGNOSIS — G8929 Other chronic pain: Secondary | ICD-10-CM | POA: Diagnosis present

## 2022-09-14 LAB — COMPREHENSIVE METABOLIC PANEL
ALT: 21 U/L (ref 0–44)
AST: 29 U/L (ref 15–41)
Albumin: 3.5 g/dL (ref 3.5–5.0)
Alkaline Phosphatase: 50 U/L (ref 38–126)
Anion gap: 7 (ref 5–15)
BUN: 14 mg/dL (ref 8–23)
CO2: 24 mmol/L (ref 22–32)
Calcium: 8.4 mg/dL — ABNORMAL LOW (ref 8.9–10.3)
Chloride: 103 mmol/L (ref 98–111)
Creatinine, Ser: 0.95 mg/dL (ref 0.61–1.24)
GFR, Estimated: >60 mL/min (ref >60)
Glucose, Bld: 117 mg/dL — ABNORMAL HIGH (ref 70–99)
Potassium: 4.3 mmol/L (ref 3.5–5.1)
Sodium: 134 mmol/L — ABNORMAL LOW (ref 135–145)
Total Bilirubin: 1.1 mg/dL (ref 0.3–1.2)
Total Protein: 6.7 g/dL (ref 6.5–8.1)

## 2022-09-14 LAB — TROPONIN I (HIGH SENSITIVITY)
Troponin I (High Sensitivity): 18 ng/L — ABNORMAL HIGH (ref <18)
Troponin I (High Sensitivity): 20 ng/L — ABNORMAL HIGH (ref <18)

## 2022-09-14 LAB — CBC WITH DIFFERENTIAL/PLATELET
Abs Immature Granulocytes: 0.04 10*3/uL (ref 0.00–0.07)
Basophils Absolute: 0 10*3/uL (ref 0.0–0.1)
Basophils Relative: 0 %
Eosinophils Absolute: 0 10*3/uL (ref 0.0–0.5)
Eosinophils Relative: 0 %
HCT: 41.6 % (ref 39.0–52.0)
Hemoglobin: 13.8 g/dL (ref 13.0–17.0)
Immature Granulocytes: 0 %
Lymphocytes Relative: 8 %
Lymphs Abs: 0.9 10*3/uL (ref 0.7–4.0)
MCH: 30.4 pg (ref 26.0–34.0)
MCHC: 33.2 g/dL (ref 30.0–36.0)
MCV: 91.6 fL (ref 80.0–100.0)
Monocytes Absolute: 0.7 10*3/uL (ref 0.1–1.0)
Monocytes Relative: 7 %
Neutro Abs: 8.5 10*3/uL — ABNORMAL HIGH (ref 1.7–7.7)
Neutrophils Relative %: 85 %
Platelets: 216 10*3/uL (ref 150–400)
RBC: 4.54 MIL/uL (ref 4.22–5.81)
RDW: 13.4 % (ref 11.5–15.5)
WBC: 10.1 10*3/uL (ref 4.0–10.5)
nRBC: 0 % (ref 0.0–0.2)

## 2022-09-14 LAB — RESP PANEL BY RT-PCR (RSV, FLU A&B, COVID)  RVPGX2
Influenza A by PCR: NEGATIVE
Influenza B by PCR: NEGATIVE
Resp Syncytial Virus by PCR: NEGATIVE
SARS Coronavirus 2 by RT PCR: NEGATIVE

## 2022-09-14 LAB — PROTIME-INR
INR: 5 (ref 0.8–1.2)
Prothrombin Time: 46 seconds — ABNORMAL HIGH (ref 11.4–15.2)

## 2022-09-14 LAB — BRAIN NATRIURETIC PEPTIDE: B Natriuretic Peptide: 961 pg/mL — ABNORMAL HIGH (ref 0.0–100.0)

## 2022-09-14 MED ORDER — ONDANSETRON HCL 4 MG PO TABS: 4.0000 mg | ORAL_TABLET | Freq: Four times a day (QID) | ORAL | Status: DC | PRN

## 2022-09-14 MED ORDER — FUROSEMIDE 10 MG/ML IJ SOLN
40.0000 mg | Freq: Once | INTRAMUSCULAR | Status: AC
  Administered 2022-09-14: 40 mg via INTRAVENOUS
  Filled 2022-09-14: qty 4

## 2022-09-14 MED ORDER — IRBESARTAN 150 MG PO TABS
150.0000 mg | ORAL_TABLET | Freq: Every day | ORAL | Status: DC
  Administered 2022-09-15 – 2022-09-17 (×3): 150 mg via ORAL
  Filled 2022-09-14 (×3): qty 1

## 2022-09-14 MED ORDER — IOHEXOL 300 MG/ML  SOLN
75.0000 mL | Freq: Once | INTRAMUSCULAR | Status: AC | PRN
  Administered 2022-09-14: 75 mL via INTRAVENOUS

## 2022-09-14 MED ORDER — WARFARIN SODIUM 2.5 MG PO TABS: 5.0000 mg | ORAL_TABLET | Freq: Every day | ORAL | Status: DC

## 2022-09-14 MED ORDER — EZETIMIBE 10 MG PO TABS
10.0000 mg | ORAL_TABLET | Freq: Every day | ORAL | Status: DC
  Administered 2022-09-14 – 2022-09-17 (×4): 10 mg via ORAL
  Filled 2022-09-14 (×4): qty 1

## 2022-09-14 MED ORDER — DIGOXIN 125 MCG PO TABS
125.0000 ug | ORAL_TABLET | Freq: Every day | ORAL | Status: DC
  Administered 2022-09-15 – 2022-09-17 (×3): 125 ug via ORAL
  Filled 2022-09-14 (×3): qty 1

## 2022-09-14 MED ORDER — FLUTICASONE PROPIONATE 50 MCG/ACT NA SUSP
2.0000 | Freq: Every day | NASAL | Status: DC
  Administered 2022-09-15 – 2022-09-17 (×3): 2 via NASAL
  Filled 2022-09-14: qty 16

## 2022-09-14 MED ORDER — PHYTONADIONE 5 MG PO TABS
2.5000 mg | ORAL_TABLET | Freq: Once | ORAL | Status: AC
  Administered 2022-09-14: 2.5 mg via ORAL
  Filled 2022-09-14: qty 1

## 2022-09-14 MED ORDER — ONDANSETRON HCL 4 MG/2ML IJ SOLN: 4.0000 mg | Freq: Four times a day (QID) | INTRAMUSCULAR | Status: DC | PRN

## 2022-09-14 MED ORDER — LORATADINE 10 MG PO TABS
10.0000 mg | ORAL_TABLET | Freq: Every day | ORAL | Status: DC
  Administered 2022-09-15 – 2022-09-17 (×3): 10 mg via ORAL
  Filled 2022-09-14 (×3): qty 1

## 2022-09-14 MED ORDER — ATORVASTATIN CALCIUM 40 MG PO TABS
80.0000 mg | ORAL_TABLET | Freq: Every evening | ORAL | Status: DC
  Administered 2022-09-14 – 2022-09-16 (×3): 80 mg via ORAL
  Filled 2022-09-14 (×3): qty 2

## 2022-09-14 MED ORDER — MONTELUKAST SODIUM 10 MG PO TABS
10.0000 mg | ORAL_TABLET | Freq: Every day | ORAL | Status: DC
  Administered 2022-09-15 – 2022-09-17 (×3): 10 mg via ORAL
  Filled 2022-09-14 (×3): qty 1

## 2022-09-14 MED ORDER — DORZOLAMIDE HCL-TIMOLOL MAL 2-0.5 % OP SOLN
1.0000 [drp] | Freq: Two times a day (BID) | OPHTHALMIC | Status: DC
  Administered 2022-09-14 – 2022-09-17 (×6): 1 [drp] via OPHTHALMIC
  Filled 2022-09-14 (×2): qty 10

## 2022-09-14 MED ORDER — ALBUTEROL SULFATE HFA 108 (90 BASE) MCG/ACT IN AERS
2.0000 | INHALATION_SPRAY | RESPIRATORY_TRACT | Status: DC | PRN
  Administered 2022-09-14: 2 via RESPIRATORY_TRACT
  Filled 2022-09-14: qty 6.7

## 2022-09-14 MED ORDER — POLYETHYLENE GLYCOL 3350 17 G PO PACK: 17.0000 g | PACK | Freq: Every day | ORAL | Status: DC | PRN

## 2022-09-14 MED ORDER — WARFARIN - PHARMACIST DOSING INPATIENT: Freq: Every day | Status: DC

## 2022-09-14 MED ORDER — POTASSIUM CHLORIDE CRYS ER 20 MEQ PO TBCR
20.0000 meq | EXTENDED_RELEASE_TABLET | Freq: Two times a day (BID) | ORAL | Status: DC
  Administered 2022-09-14 – 2022-09-17 (×6): 20 meq via ORAL
  Filled 2022-09-14 (×6): qty 1

## 2022-09-14 MED ORDER — ASPIRIN 81 MG PO TBEC
81.0000 mg | DELAYED_RELEASE_TABLET | Freq: Every day | ORAL | Status: DC
  Administered 2022-09-15 – 2022-09-17 (×3): 81 mg via ORAL
  Filled 2022-09-14 (×3): qty 1

## 2022-09-14 MED ORDER — FUROSEMIDE 10 MG/ML IJ SOLN
40.0000 mg | Freq: Two times a day (BID) | INTRAMUSCULAR | Status: DC
  Administered 2022-09-15 – 2022-09-17 (×5): 40 mg via INTRAVENOUS
  Filled 2022-09-14 (×6): qty 4

## 2022-09-14 MED ORDER — PANTOPRAZOLE SODIUM 40 MG PO TBEC
40.0000 mg | DELAYED_RELEASE_TABLET | Freq: Every morning | ORAL | Status: DC
  Administered 2022-09-15 – 2022-09-17 (×3): 40 mg via ORAL
  Filled 2022-09-14 (×3): qty 1

## 2022-09-14 MED ORDER — METOPROLOL SUCCINATE ER 50 MG PO TB24
50.0000 mg | ORAL_TABLET | Freq: Every day | ORAL | Status: DC
  Administered 2022-09-14 – 2022-09-17 (×4): 50 mg via ORAL
  Filled 2022-09-14 (×4): qty 1

## 2022-09-14 NOTE — H&P (Signed)
History and Physical    Patient: [name] DOB: Aug 23, 1940 DOA: 09/14/2022 DOS: the patient was seen and examined on 09/14/2022 PCP: Ninfa Meeker, MD  Patient coming from: Home  Chief Complaint:  Chief Complaint  Patient presents with   Shortness of Breath   HPI: [name] is a 82 y.o. 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  Patient having increasing shortness of breath with dyspnea on exertion worsening since this morning.  He has to stop after about 38 due to worsening shortness of breath.  He does have orthopnea.  Mild peripheral edema.  Uncertain about weight gain. No fevers, chills, nausea, vomiting.  No recent illnesses or pneumonia. Review of Systems: As mentioned in the history of present illness. All other systems reviewed and are negative. Past Medical History:  Diagnosis Date   Dysrhythmia     dr Janith Lima  in [place]   Glaucoma    Hypertension    Myocardial infarction [facility])    2005   Past Surgical History:  Procedure Laterality Date   CORONARY ARTERY BYPASS GRAFT     2005   HERNIA REPAIR     INSERT / REPLACE / REMOVE PACEMAKER     [place]   dr Ashley Akin   Social History:  reports that he has never smoked. He has never used smokeless tobacco. He reports that he does not drink alcohol and does not use drugs.  No Known Allergies  History reviewed. No pertinent family history.  Prior to Admission medications   Medication Sig Start Date End Date Taking? Authorizing Provider  Ascorbic Acid 100 MG CHEW Chew 1,000 mg by mouth daily.   Yes [provider]  aspirin EC 81 MG tablet Take 81 mg by mouth daily.   Yes [provider]  atorvastatin (LIPITOR) 80 MG tablet Take 80 mg by mouth daily.   Yes [provider]  digoxin (LANOXIN) 0.125 MG tablet Take 125 mcg by mouth daily.   Yes [provider]   dorzolamide-timolol (COSOPT) 22.3-6.8 MG/ML ophthalmic solution Place 1 drop into both eyes 2 (two) times daily.   Yes [provider]  ezetimibe (ZETIA) 10 MG tablet Take 10 mg by mouth daily.   Yes [provider]  fexofenadine (ALLEGRA) 180 MG tablet Take 180 mg by mouth daily.   Yes [provider]  fluticasone (FLONASE) 50 MCG/ACT nasal spray Place into both nostrils.   Yes [provider]  meclizine (ANTIVERT) 25 MG tablet Take 1 tablet (25 mg total) by mouth 3 (three) times daily as needed for dizziness. 01/15/21  Yes Fredia Sorrow, MD  metoprolol succinate (TOPROL-XL) 50 MG 24 hr tablet Take 50 mg by mouth daily. 08/29/22  Yes [provider]  montelukast (SINGULAIR) 10 MG tablet Take 10 mg by mouth daily.   Yes [provider]  Multiple Vitamins-Minerals (PRESERVISION AREDS) TABS Take by mouth.   Yes [provider]  nitroGLYCERIN (NITROSTAT) 0.4 MG SL tablet Place 0.4 mg under the tongue every 5 (five) minutes as needed. Chest pain.   Yes [provider]  olmesartan (BENICAR) 20 MG tablet Take 20 mg by mouth daily.   Yes [provider]  pantoprazole (PROTONIX) 40 MG tablet Take 40 mg by mouth every morning.   Yes [provider]  PERIOGARD 0.12 % solution SMARTSIG:By Mouth 08/13/22  Yes [provider]  warfarin (  COUMADIN) 5 MG tablet Take 5 mg by mouth daily.   Yes [provider]  losartan (COZAAR) 100 MG tablet Take 100 mg by mouth daily. Patient not taking: Reported on 09/14/2022    [provider]    Physical Exam: Vitals:   09/14/22 1342 09/14/22 1411 09/14/22 1500 09/14/22 1515  BP: (!) 183/94 (!) 188/80 (!) 170/77   Pulse: 69 71 70   Resp: (!) 27 (!) 24 (!) 27   Temp:    97.7 F (36.5 C)  TempSrc:    Oral  SpO2: 97% 98% 98%   Weight:      Height:       General: Elderly male. Awake and alert and oriented x3. No acute cardiopulmonary distress.  HEENT:  Normocephalic atraumatic.  Right and left ears normal in appearance.  Pupils equal, round, reactive to light. Extraocular muscles are intact. Sclerae anicteric and noninjected.  Moist mucosal membranes. No mucosal lesions.  Neck: Neck supple without lymphadenopathy. No carotid bruits. No masses palpated.  Cardiovascular: Regular rate with normal S1-S2 sounds. No murmurs, rubs, gallops auscultated. Increased JVD.  Respiratory: Rales bilaterally.  No accessory muscle use. Abdomen: Soft, nontender, nondistended. Active bowel sounds. No masses or hepatosplenomegaly  Skin: No rashes, lesions, or ulcerations.  Dry, warm to touch. 2+ dorsalis pedis and radial pulses. Musculoskeletal: No calf or leg pain. All major joints not erythematous nontender.  No upper or lower joint deformation.  Good ROM.  No contractures  Psychiatric: Intact judgment and insight. Pleasant and cooperative. Neurologic: No focal neurological deficits. Strength is 5/5 and symmetric in upper and lower extremities.  Cranial nerves II through XII are grossly intact.  Data Reviewed: Results for orders placed or performed during the hospital encounter of 09/14/22 (from the past 24 hour(s))  CBC with Differential     Status: Abnormal   Collection Time: 09/14/22 10:25 AM  Result Value Ref Range   WBC 10.1 4.0 - 10.5 K/uL   RBC 4.54 4.22 - 5.81 MIL/uL   Hemoglobin 13.8 13.0 - 17.0 g/dL   HCT 41.6 39.0 - 52.0 %   MCV 91.6 80.0 - 100.0 fL   MCH 30.4 26.0 - 34.0 pg   MCHC 33.2 30.0 - 36.0 g/dL   RDW 13.4 11.5 - 15.5 %   Platelets 216 150 - 400 K/uL   nRBC 0.0 0.0 - 0.2 %   Neutrophils Relative % 85 %   Neutro Abs 8.5 (H) 1.7 - 7.7 K/uL   Lymphocytes Relative 8 %   Lymphs Abs 0.9 0.7 - 4.0 K/uL   Monocytes Relative 7 %   Monocytes Absolute 0.7 0.1 - 1.0 K/uL   Eosinophils Relative 0 %   Eosinophils Absolute 0.0 0.0 - 0.5 K/uL   Basophils Relative 0 %   Basophils Absolute 0.0 0.0 - 0.1 K/uL   Immature Granulocytes 0 %   Abs  Immature Granulocytes 0.04 0.00 - 0.07 K/uL  Comprehensive metabolic panel     Status: Abnormal   Collection Time: 09/14/22 10:25 AM  Result Value Ref Range   Sodium 134 (L) 135 - 145 mmol/L   Potassium 4.3 3.5 - 5.1 mmol/L   Chloride 103 98 - 111 mmol/L   CO2 24 22 - 32 mmol/L   Glucose, Bld 117 (H) 70 - 99 mg/dL   BUN 14 8 - 23 mg/dL   Creatinine, Ser 0.95 0.61 - 1.24 mg/dL   Calcium 8.4 (L) 8.9 - 10.3 mg/dL   Total Protein 6.7 6.5 -  8.1 g/dL   Albumin 3.5 3.5 - 5.0 g/dL   AST 29 15 - 41 U/L   ALT 21 0 - 44 U/L   Alkaline Phosphatase 50 38 - 126 U/L   Total Bilirubin 1.1 0.3 - 1.2 mg/dL   GFR, Estimated >60 >60 mL/min   Anion gap 7 5 - 15  Troponin I (High Sensitivity)     Status: Abnormal   Collection Time: 09/14/22 10:25 AM  Result Value Ref Range   Troponin I (High Sensitivity) 18 (H) <18 ng/L  Resp panel by RT-PCR (RSV, Flu A&B, Covid) Anterior Nasal Swab     Status: None   Collection Time: 09/14/22 10:35 AM   Specimen: Anterior Nasal Swab  Result Value Ref Range   SARS Coronavirus 2 by RT PCR NEGATIVE NEGATIVE   Influenza A by PCR NEGATIVE NEGATIVE   Influenza B by PCR NEGATIVE NEGATIVE   Resp Syncytial Virus by PCR NEGATIVE NEGATIVE  Troponin I (High Sensitivity)     Status: Abnormal   Collection Time: 09/14/22 12:05 PM  Result Value Ref Range   Troponin I (High Sensitivity) 20 (H) <18 ng/L  Brain natriuretic peptide     Status: Abnormal   Collection Time: 09/14/22  1:36 PM  Result Value Ref Range   B Natriuretic Peptide 961.0 (H) 0.0 - 100.0 pg/mL   CT Chest W Contrast  Result Date: 09/14/2022 CLINICAL DATA:  Abnormal xray - lung nodule, >= 1 cm EXAM: CT CHEST WITH CONTRAST TECHNIQUE: Multidetector CT imaging of the chest was performed during intravenous contrast administration. RADIATION DOSE REDUCTION: This exam was performed according to the departmental dose-optimization program which includes automated exposure control, adjustment of the mA and/or kV according  to patient size and/or use of iterative reconstruction technique. CONTRAST:  55m OMNIPAQUE IOHEXOL 300 MG/ML  SOLN COMPARISON:  Chest radiograph earlier today. FINDINGS: Cardiovascular: Left-sided pacemaker with lead tips in the right atrium and ventricle. The heart is enlarged. Prior median sternotomy and CABG with calcifications of the native coronary arteries. 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 No aortic dissection or acute aortic findings. There is no central pulmonary embolus. No pericardial effusion. Mediastinum/Nodes: Few calcified mediastinal lymph nodes typical of prior granulomatous disease. No noncalcified adenopathy. There is a small hiatal hernia. Lungs/Pleura: Moderate right and small to moderate left pleural effusion. The left pleural effusion is partially loculated and tracks anteriorly and into the inter lobar fissure. Pleural fluid accounts for the radiographic appearance of mid lung opacity. Mild smooth septal thickening suggestive of pulmonary edema. There is background emphysema with bronchial thickening. No suspicious pulmonary nodule. Upper Abdomen: Gallstone. Simple cyst in the left lobe of the liver, needs no further follow-up. Small to moderate-sized hiatal hernia. Musculoskeletal: Prior median sternotomy. Moderate right glenohumeral osteoarthritis. There are no acute or suspicious osseous abnormalities. No chest wall soft tissue abnormalities. IMPRESSION: 1. Moderate right and small to moderate left pleural effusions. The left pleural effusion is partially loculated and tracks anteriorly and into the inter lobar fissure. This accounts for the radiographic appearance of mid lung opacity. 2. Cardiomegaly with mild pulmonary edema. 3. 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 No aortic dissection or acute aortic findings. Recommend annual imaging followup by CTA or MRA. This recommendation follows 2010  ACCF/AHA/AATS/ACR/ASA/SCA/SCAI/SIR/STS/SVM Guidelines for the Diagnosis and Management of Patients with Thoracic Aortic Disease. Circulation. 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 Small to moderate-sized hiatal hernia. 5. Cholelithiasis. 6. Emphysema.  Bronchial thickening. Aortic Atherosclerosis (ICD10-I70.0) and Emphysema (ICD10-J43.9). Electronically Signed   By: Keith Rake M.D.   On: 09/14/2022 15:13   DG Chest Port 1 View  Result Date: 09/14/2022 CLINICAL DATA:  Chest pain EXAM: PORTABLE CHEST 1 VIEW COMPARISON:  Chest x-rays dated 12/03/2011 and 12/01/2008 FINDINGS: LEFT chest wall pacemaker/ICD apparatus appears grossly stable in position. Median sternotomy wires appear intact and stable alignment. Cardiomegaly. Increased interstitial markings bilaterally. More confluent opacity is seen at the periphery of the LEFT perihilar lung, suspected effusion or thickening at the LEFT lung fissure. Probable small bilateral pleural effusions. No pneumothorax is seen. IMPRESSION: 1. Coarse interstitial markings bilaterally, suggesting CHF/volume overload, alternatively chronic interstitial lung disease. 2. More confluent opacity at the periphery of the LEFT perihilar lung, suspected effusion or thickening at the LEFT lung fissure. However, consider chest CT to exclude consolidating pneumonia or neoplastic mass. 3. Probable small bilateral pleural effusions. 4. Cardiomegaly. Electronically Signed   By: Franki Cabot M.D.   On: 09/14/2022 10:50     Assessment and Plan: No notes have been filed under this hospital service. Service: Hospitalist  Principal Problem:   Acute exacerbation of CHF (congestive heart failure) (HCC) Active Problems:   Atrial fibrillation (HCC)   CAD (coronary artery disease)   Chronic anticoagulation   Hypertension   Presence of cardiac pacemaker   S/P CABG x 3   Moderate aortic regurgitation   Hyperlipidemia  Acute CHF Telemetry monitoring Strict  I/O Daily Weights Diuresis: lasix Potassium: 20 mEq twice a day by mouth Echo cardiac exam tomorrow Repeat BMP tomorrow  CAD with CABG x3 ASA A fib on anticoagulation, Pacemaker present. Continue dig, metoprolol HTN Continue metoprolol and ACE.    Advance Care Planning:   Code Status: Full Code confirmed by patient  Consults: none  Family Communication: wife present  Severity of Illness: The appropriate patient status for this patient is INPATIENT. Inpatient status is judged to be reasonable and necessary in order to provide the required intensity of service to ensure the patient's safety. The patient's presenting symptoms, physical exam findings, and initial radiographic and laboratory data in the context of their chronic comorbidities is felt to place them at high risk for further clinical deterioration. Furthermore, it is not anticipated that the patient will be medically stable for discharge from the hospital within 2 midnights of admission.   * I certify that at the point of admission it is my clinical judgment that the patient will require inpatient hospital care spanning beyond 2 midnights from the point of admission due to high intensity of service, high risk for further deterioration and high frequency of surveillance required.*  Author: Truett Mainland, DO 09/14/2022 4:13 PM  For on call review [email or web address].

## 2022-09-14 NOTE — ED Notes (Signed)
Pt states he's feeling SOB still so offered Albuterol puffs for comfort.

## 2022-09-14 NOTE — ED Notes (Signed)
Placed pt on 1L O2 for comfort in breathing. Pt does not use O2 at home.

## 2022-09-14 NOTE — Consult Note (Addendum)
ANTICOAGULATION CONSULT NOTE - Initial Consult  Pharmacy Consult for warfarin dosing Indication: atrial fibrillation  No Known Allergies  Patient Measurements: Height: 5' 11"$  (180.3 cm) Weight: 74.3 kg (163 lb 12.8 oz) IBW/kg (Calculated) : 75.3   Vital Signs: Temp: 97.8 F (36.6 C) (02/24 1708) Temp Source: Oral (02/24 1708) BP: 165/78 (02/24 1708) Pulse Rate: 65 (02/24 1708)  Labs: Recent Labs    09/14/22 1025 09/14/22 1205  HGB 13.8  --   HCT 41.6  --   PLT 216  --   CREATININE 0.95  --   TROPONINIHS 18* 20*    Estimated Creatinine Clearance: 61.9 mL/min (by C-G formula based on SCr of 0.95 mg/dL).   Medical History: Past Medical History:  Diagnosis Date   Dysrhythmia     dr Janith Lima  in [place]   Glaucoma    Hypertension    Myocardial infarction [facility])    2005    Medications:  Medications Prior to Admission  Medication Sig Dispense Refill Last Dose   Ascorbic Acid 100 MG CHEW Chew 1,000 mg by mouth daily.   09/14/2022   aspirin EC 81 MG tablet Take 81 mg by mouth daily.   09/14/2022   atorvastatin (LIPITOR) 80 MG tablet Take 80 mg by mouth daily.   09/13/2022   digoxin (LANOXIN) 0.125 MG tablet Take 125 mcg by mouth daily.   09/14/2022   dorzolamide-timolol (COSOPT) 22.3-6.8 MG/ML ophthalmic solution Place 1 drop into both eyes 2 (two) times daily.   09/14/2022   ezetimibe (ZETIA) 10 MG tablet Take 10 mg by mouth daily.   09/13/2022   fexofenadine (ALLEGRA) 180 MG tablet Take 180 mg by mouth daily.   09/13/2022   fluticasone (FLONASE) 50 MCG/ACT nasal spray Place into both nostrils.   09/14/2022   meclizine (ANTIVERT) 25 MG tablet Take 1 tablet (25 mg total) by mouth 3 (three) times daily as needed for dizziness. 30 tablet 0 unknown   metoprolol succinate (TOPROL-XL) 50 MG 24 hr tablet Take 50 mg by mouth daily.   09/14/2022 at 0800   montelukast (SINGULAIR) 10 MG tablet Take 10 mg by mouth daily.   09/14/2022   Multiple Vitamins-Minerals (PRESERVISION AREDS) TABS  Take by mouth.   09/14/2022   nitroGLYCERIN (NITROSTAT) 0.4 MG SL tablet Place 0.4 mg under the tongue every 5 (five) minutes as needed. Chest pain.   unknown   olmesartan (BENICAR) 20 MG tablet Take 20 mg by mouth daily.   09/14/2022   pantoprazole (PROTONIX) 40 MG tablet Take 40 mg by mouth every morning.   09/14/2022   PERIOGARD 0.12 % solution SMARTSIG:By Mouth   09/14/2022   warfarin (COUMADIN) 5 MG tablet Take 5 mg by mouth daily.   09/13/2022 at 1900   losartan (COZAAR) 100 MG tablet Take 100 mg by mouth daily. (Patient not taking: Reported on 09/14/2022)   Not Taking    Assessment: 82 y.o. 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  Patient having increasing shortness of breath with dyspnea on exertion worsening since this morning. Pharmacy managing warfarin regimen for Afib. Home regimen: 51m daily.  Current INR supratherapeutic @5$ .0.  Vit K 2.530mPo ordered by MD.  Goal of Therapy:  INR 2-3 Monitor platelets by anticoagulation protocol: Yes   Plan:    No warfarin indicated to be given tonight.  Will follow daily INR. Monitor for S&S of bleeding.  SuBerta Minor/24/2024,5:48  PM   

## 2022-09-14 NOTE — ED Triage Notes (Signed)
Pt comes from home. Has pacemaker, due for change Feb 29th @ [facility]. Pt states he went to [facility] on Feb 20 for same reason and was sent home with instructions to take tylenol prn. No pain, jsut SOB for about 1 week

## 2022-09-14 NOTE — ED Provider Notes (Signed)
[place] EMERGENCY DEPARTMENT AT [facility] Provider Note   CSN: [id number] Arrival date & time: 09/14/22  4098     History  Chief Complaint  Patient presents with   Shortness of Breath    [name] is a 82 y.o. male.  Patient has a history of coronary artery disease and bypass surgery.  He complains of shortness of breath on exertion.  The history is provided by the patient and medical records. No language interpreter was used.  Shortness of Breath Severity:  Moderate Onset quality:  Sudden Timing:  Constant Progression:  Worsening Chronicity:  New Context: activity   Relieved by:  Nothing Worsened by:  Nothing Ineffective treatments:  None tried Associated symptoms: no abdominal pain, no chest pain, no cough, no headaches and no rash        Home Medications Prior to Admission medications   Medication Sig Start Date End Date Taking? Authorizing Provider  amLODipine (NORVASC) 5 MG tablet Take 5 mg by mouth daily.    [provider]  atorvastatin (LIPITOR) 80 MG tablet Take 80 mg by mouth daily.    [provider]  Cholecalciferol (VITAMIN D) 2000 UNITS CAPS Take 1 capsule by mouth daily.    [provider]  digoxin (LANOXIN) 0.125 MG tablet Take 125 mcg by mouth daily.    [provider]  dorzolamide-timolol (COSOPT) 22.3-6.8 MG/ML ophthalmic solution Place 1 drop into both eyes 2 (two) times daily.    [provider]  losartan (COZAAR) 100 MG tablet Take 100 mg by mouth daily.    [provider]  meclizine (ANTIVERT) 25 MG tablet Take 1 tablet (25 mg total) by mouth 3 (three) times daily as needed for dizziness. 01/15/21   Vanetta Mulders, MD  Multiple Vitamin (MULITIVITAMIN WITH MINERALS) TABS Take 1 tablet by mouth daily.    [provider]  nitroGLYCERIN (NITROSTAT) 0.4 MG SL tablet Place 0.4 mg under the tongue every 5 (five) minutes as needed. Chest pain.    [provider]   pilocarpine (PILOCAR) 4 % ophthalmic solution Place 1 drop into the left eye 4 (four) times daily.    [provider]      Allergies    Patient has no known allergies.    Review of Systems   Review of Systems  Constitutional:  Negative for appetite change and fatigue.  HENT:  Negative for congestion, ear discharge and sinus pressure.   Eyes:  Negative for discharge.  Respiratory:  Positive for shortness of breath. Negative for cough.   Cardiovascular:  Negative for chest pain.  Gastrointestinal:  Negative for abdominal pain and diarrhea.  Genitourinary:  Negative for frequency and hematuria.  Musculoskeletal:  Negative for back pain.  Skin:  Negative for rash.  Neurological:  Negative for seizures and headaches.  Psychiatric/Behavioral:  Negative for hallucinations.     Physical Exam Updated Vital Signs BP (!) 170/77   Pulse 70   Temp 97.7 F (36.5 C) (Oral)   Resp (!) 27   Ht 5\' 11"  (1.803 m)   Wt 68 kg   SpO2 98%   BMI 20.91 kg/m  Physical Exam Vitals and nursing note reviewed.  Constitutional:      Appearance: He is well-developed.  HENT:     Head: Normocephalic.     Mouth/Throat:     Mouth: Mucous membranes are moist.  Eyes:     General: No scleral icterus.    Conjunctiva/sclera: Conjunctivae normal.  Neck:  Thyroid: No thyromegaly.  Cardiovascular:     Rate and Rhythm: Normal rate and regular rhythm.     Heart sounds: No murmur heard.    No friction rub. No gallop.  Pulmonary:     Breath sounds: No stridor. No wheezing or rales.  Chest:     Chest wall: No tenderness.  Abdominal:     General: There is no distension.     Tenderness: There is no abdominal tenderness. There is no rebound.  Musculoskeletal:        General: Normal range of motion.     Cervical back: Neck supple.  Lymphadenopathy:     Cervical: No cervical adenopathy.  Skin:    Findings: No erythema or rash.  Neurological:     Mental Status: He is alert and oriented to  person, place, and time.     Motor: No abnormal muscle tone.     Coordination: Coordination normal.  Psychiatric:        Behavior: Behavior normal.     ED Results / Procedures / Treatments   Labs (all labs ordered are listed, but only abnormal results are displayed) Labs Reviewed  CBC WITH DIFFERENTIAL/PLATELET - Abnormal; Notable for the following components:      Result Value   Neutro Abs 8.5 (*)    All other components within normal limits  COMPREHENSIVE METABOLIC PANEL - Abnormal; Notable for the following components:   Sodium 134 (*)    Glucose, Bld 117 (*)    Calcium 8.4 (*)    All other components within normal limits  BRAIN NATRIURETIC PEPTIDE - Abnormal; Notable for the following components:   B Natriuretic Peptide 961.0 (*)    All other components within normal limits  TROPONIN I (HIGH SENSITIVITY) - Abnormal; Notable for the following components:   Troponin I (High Sensitivity) 18 (*)    All other components within normal limits  TROPONIN I (HIGH SENSITIVITY) - Abnormal; Notable for the following components:   Troponin I (High Sensitivity) 20 (*)    All other components within normal limits  RESP PANEL BY RT-PCR (RSV, FLU A&B, COVID)  [id number]    EKG EKG Interpretation  Date/Time:  Saturday September 14 2022 10:10:25 EST Ventricular Rate:  64 PR Interval:  58 QRS Duration: 186 QT Interval:  479 QTC Calculation: 495 R Axis:   -63 Text Interpretation: Sinus rhythm Ventricular premature complex Short PR interval Left bundle branch block Confirmed by Bethann Berkshire [phone]) on 09/14/2022 3:21:08 PM  Radiology CT Chest W Contrast  Result Date: 09/14/2022 CLINICAL DATA:  Abnormal xray - lung nodule, >= 1 cm EXAM: CT CHEST WITH CONTRAST TECHNIQUE: Multidetector CT imaging of the chest was performed during intravenous contrast administration. RADIATION DOSE REDUCTION: This exam was performed according to the departmental dose-optimization program which includes automated  exposure control, adjustment of the mA and/or kV according to patient size and/or use of iterative reconstruction technique. CONTRAST:  75mL OMNIPAQUE IOHEXOL 300 MG/ML  SOLN COMPARISON:  Chest radiograph earlier today. FINDINGS: Cardiovascular: Left-sided pacemaker with lead tips in the right atrium and ventricle. The heart is enlarged. Prior median sternotomy and CABG with calcifications of the native coronary arteries. 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 No aortic dissection or acute aortic findings. There is no central pulmonary embolus. No pericardial effusion. Mediastinum/Nodes: Few calcified mediastinal lymph nodes typical of prior granulomatous disease. No noncalcified adenopathy. There is a small hiatal hernia. Lungs/Pleura: Moderate right and small to moderate left pleural  effusion. The left pleural effusion is partially loculated and tracks anteriorly and into the inter lobar fissure. Pleural fluid accounts for the radiographic appearance of mid lung opacity. Mild smooth septal thickening suggestive of pulmonary edema. There is background emphysema with bronchial thickening. No suspicious pulmonary nodule. Upper Abdomen: Gallstone. Simple cyst in the left lobe of the liver, needs no further follow-up. Small to moderate-sized hiatal hernia. Musculoskeletal: Prior median sternotomy. Moderate right glenohumeral osteoarthritis. There are no acute or suspicious osseous abnormalities. No chest wall soft tissue abnormalities. IMPRESSION: 1. Moderate right and small to moderate left pleural effusions. The left pleural effusion is partially loculated and tracks anteriorly and into the inter lobar fissure. This accounts for the radiographic appearance of mid lung opacity. 2. Cardiomegaly with mild pulmonary edema. 3. 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 No aortic dissection or acute aortic findings. Recommend annual imaging followup by CTA  or MRA. This recommendation follows 2010 ACCF/AHA/AATS/ACR/ASA/SCA/SCAI/SIR/STS/SVM Guidelines for the Diagnosis and Management of Patients with Thoracic Aortic Disease. Circulation. 2010; 121: G956-O130. Aortic aneurysm NOS (ICD10-I71.9) 4. Small to moderate-sized hiatal hernia. 5. Cholelithiasis. 6. Emphysema.  Bronchial thickening. Aortic Atherosclerosis (ICD10-I70.0) and Emphysema (ICD10-J43.9). Electronically Signed   By: Narda Rutherford M.D.   On: 09/14/2022 15:13   DG Chest Port 1 View  Result Date: 09/14/2022 CLINICAL DATA:  Chest pain EXAM: PORTABLE CHEST 1 VIEW COMPARISON:  Chest x-rays dated 12/03/2011 and 12/01/2008 FINDINGS: LEFT chest wall pacemaker/ICD apparatus appears grossly stable in position. Median sternotomy wires appear intact and stable alignment. Cardiomegaly. Increased interstitial markings bilaterally. More confluent opacity is seen at the periphery of the LEFT perihilar lung, suspected effusion or thickening at the LEFT lung fissure. Probable small bilateral pleural effusions. No pneumothorax is seen. IMPRESSION: 1. Coarse interstitial markings bilaterally, suggesting CHF/volume overload, alternatively chronic interstitial lung disease. 2. More confluent opacity at the periphery of the LEFT perihilar lung, suspected effusion or thickening at the LEFT lung fissure. However, consider chest CT to exclude consolidating pneumonia or neoplastic mass. 3. Probable small bilateral pleural effusions. 4. Cardiomegaly. Electronically Signed   By: Bary Richard M.D.   On: 09/14/2022 10:50    Procedures Procedures    Medications Ordered in ED Medications  albuterol (VENTOLIN HFA) 108 (90 Base) MCG/ACT inhaler 2 puff (2 puffs Inhalation Given 09/14/22 1409)  furosemide (LASIX) injection 40 mg (has no administration in time range)  iohexol (OMNIPAQUE) 300 MG/ML solution 75 mL (75 mLs Intravenous Contrast Given 09/14/22 1431)    ED Course/ Medical Decision Making/ A&P                              Medical Decision Making Amount and/or Complexity of Data Reviewed Labs: ordered. Radiology: ordered.  Risk Prescription drug management. Decision regarding hospitalization.  This patient presents to the ED for concern of shortness of breath, this involves an extensive number of treatment options, and is a complaint that carries with it a high risk of complications and morbidity.  The differential diagnosis includes congestive heart failure, pneumonia   Co morbidities that complicate the patient evaluation  Coronary artery disease   Additional history obtained:  Additional history obtained from patient External records from outside source obtained and reviewed including hospital records   Lab Tests:  I Ordered, and personally interpreted labs.  The pertinent results include: BMP 961, troponin 20   Imaging Studies ordered:  I ordered imaging studies including chest x-ray  I independently visualized and interpreted imaging which showed congestive heart failure I agree with the radiologist interpretation   Cardiac Monitoring: / EKG:  The patient was maintained on a cardiac monitor.  I personally viewed and interpreted the cardiac monitored which showed an underlying rhythm of: Normal sinus rhythm   Consultations Obtained:  I requested consultation with the cardiology and hospitalist,  and discussed lab and imaging findings as well as pertinent plan - they recommend: Admit to hospitalist with cardiology consult.  Patient will be diuresed   Problem List / ED Course / Critical interventions / Medication management  Shortness of breath I ordered medication including Lasix for diuresis Reevaluation of the patient after these medicines showed that the patient improved I have reviewed the patients home medicines and have made adjustments as needed   Social Determinants of Health:  None   Test / Admission - Considered:  None  Patient with mild to moderate  congestive heart failure.  He will be admitted to medicine and diuresed        Final Clinical Impression(s) / ED Diagnoses Final diagnoses:  Systolic congestive heart failure, unspecified HF chronicity (HCC)    Rx / DC Orders ED Discharge Orders     None         Bethann Berkshire, MD 09/17/22 1432

## 2022-09-15 ENCOUNTER — Inpatient Hospital Stay (HOSPITAL_COMMUNITY): Payer: Medicare Other

## 2022-09-15 DIAGNOSIS — I509 Heart failure, unspecified: Secondary | ICD-10-CM

## 2022-09-15 DIAGNOSIS — I5041 Acute combined systolic (congestive) and diastolic (congestive) heart failure: Secondary | ICD-10-CM | POA: Diagnosis not present

## 2022-09-15 LAB — BASIC METABOLIC PANEL
Anion gap: 6 (ref 5–15)
BUN: 16 mg/dL (ref 8–23)
CO2: 27 mmol/L (ref 22–32)
Calcium: 8.5 mg/dL — ABNORMAL LOW (ref 8.9–10.3)
Chloride: 103 mmol/L (ref 98–111)
Creatinine, Ser: 1.07 mg/dL (ref 0.61–1.24)
GFR, Estimated: >60 mL/min (ref >60)
Glucose, Bld: 99 mg/dL (ref 70–99)
Potassium: 3.9 mmol/L (ref 3.5–5.1)
Sodium: 136 mmol/L (ref 135–145)

## 2022-09-15 LAB — ECHOCARDIOGRAM COMPLETE
AR max vel: 1.69 cm2
AV Area VTI: 1.64 cm2
AV Area mean vel: 1.55 cm2
AV Mean grad: 12.5 mmHg
AV Peak grad: 23.8 mmHg
Ao pk vel: 2.44 m/s
Area-P 1/2: 4.8 cm2
Calc EF: 39.9 %
Height: 71 in
MV M vel: 4.78 m/s
MV Peak grad: 91.4 mmHg
MV VTI: 1.93 cm2
P 1/2 time: 538 msec
Radius: 0.5 cm
S' Lateral: 3.9 cm
Single Plane A2C EF: 45.5 %
Single Plane A4C EF: 36.5 %
Weight: 2574.97 oz

## 2022-09-15 LAB — CBC
HCT: 43.4 % (ref 39.0–52.0)
Hemoglobin: 14.3 g/dL (ref 13.0–17.0)
MCH: 30.3 pg (ref 26.0–34.0)
MCHC: 32.9 g/dL (ref 30.0–36.0)
MCV: 91.9 fL (ref 80.0–100.0)
Platelets: 241 10*3/uL (ref 150–400)
RBC: 4.72 MIL/uL (ref 4.22–5.81)
RDW: 13.6 % (ref 11.5–15.5)
WBC: 9.6 10*3/uL (ref 4.0–10.5)
nRBC: 0 % (ref 0.0–0.2)

## 2022-09-15 LAB — PROTIME-INR
INR: 4.4 (ref 0.8–1.2)
Prothrombin Time: 41.9 seconds — ABNORMAL HIGH (ref 11.4–15.2)

## 2022-09-15 MED ORDER — PERFLUTREN LIPID MICROSPHERE
1.0000 mL | INTRAVENOUS | Status: AC | PRN
  Administered 2022-09-15: 2 mL via INTRAVENOUS

## 2022-09-15 NOTE — Progress Notes (Signed)
  Echocardiogram 2D Echocardiogram has been performed.  [name] 09/15/2022, 11:18 AM

## 2022-09-15 NOTE — Consult Note (Signed)
ANTICOAGULATION CONSULT NOTE  Pharmacy Consult for warfarin  Indication: atrial fibrillation  No Known Allergies  Patient Measurements: Height: '5\' 11"'$  (180.3 cm) Weight: 73 kg (160 lb 15 oz) IBW/kg (Calculated) : 75.3   Vital Signs: Temp: 97.5 F (36.4 C) (02/25 0531) Temp Source: Oral (02/25 0531) BP: 134/63 (02/25 0531) Pulse Rate: 64 (02/25 0531)  Labs: Recent Labs    09/14/22 1025 09/14/22 1205 09/14/22 1830 09/15/22 0459  HGB 13.8  --   --  14.3  HCT 41.6  --   --  43.4  PLT 216  --   --  241  LABPROT  --   --  46.0* 41.9*  INR  --   --  5.0* 4.4*  CREATININE 0.95  --   --  1.07  TROPONINIHS 18* 20*  --   --      Estimated Creatinine Clearance: 54 mL/min (by C-G formula based on SCr of 1.07 mg/dL).   Medical History: Past Medical History:  Diagnosis Date   Dysrhythmia     dr Janith Lima  in [place]   Glaucoma    Hypertension    Myocardial infarction [facility])    2005   Assessment: 82 y.o. 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  Patient having increasing shortness of breath with dyspnea on exertion worsening since this morning. Pharmacy managing warfarin regimen for Afib.   Home regimen: '5mg'$  daily.    INR elevated on admit at 5.0, 2.'5mg'$  of vitamin k given last night. INR has trended down slightly to 4.4 this morning. CBC has remained stable. No bleeding issues noted. Will continue to follow and will plan on redosing warfarin once INR trends down within normal range.   Goal of Therapy:  INR 2-3 Monitor platelets by anticoagulation protocol: Yes   Plan:  Hold warfarin until INR within range Daily INR  Erin Hearing PharmD., BCPS Clinical Pharmacist 09/15/2022 11:17 AM

## 2022-09-15 NOTE — Hospital Course (Addendum)
82 y.o.M W/ CAD-CABG x 3 vessels, hypertension, chronic back pain who is having increasing shortness of breath with dyspnea on exertion worsening since 2/24 morning. He c/o orthopnea .,DOE, mild peripheral edema.Uncertain about weight gain.No fevers, chills, nausea, vomiting. No recent illnesses or pneumonia. In the ED troponin flat 18> 20, elevated BNP 961, fairly stable CBC CMP negative for flu and influenza COVID.  Had CT chest and x-ray: Moderate right and small to moderate left pleural effusion, left pleural effusion partially loculated and tracks anteriorly, cardiomegaly with mild pulmonary edema, mild dilatation of the ascending aorta maximum dimension 4.1 cm annual imaging suggested. Patient was admitted for acute exacerbation of CHF> placed on IV Lasix, echocardiogram obtained showed EF 40-45%, shows regional wall motion abnormalities, indeterminate diastolic parameters, no prior echo to compare.  Seen by cardiology-previous EF was 45% in 2013, so suspect this is his baseline LVEF no plan for ischemic evaluation deferring to patient's primary outpatient cardiology, continue don IV diuresis, adjusting GDMT-with entresto,, Jardiance, oral diuretics discontinue irbesartan continue Toprol, Aldactone.  He will be ambulated if does well on room air he will be discharged home with instruction to follow-up with his primary cardiologist.

## 2022-09-15 NOTE — Progress Notes (Signed)
   09/15/22 1100  ReDS Vest / Clip  Station Marker C  Ruler Value 35  ReDS Value Range < 36  ReDS Actual Value 30

## 2022-09-15 NOTE — Progress Notes (Signed)
PROGRESS NOTE [name]  [id number] DOB: 1941-03-17 DOA: 09/14/2022 PCP: Ninfa Meeker, MD   Brief Narrative/Hospital Course: 82 y.o.M W/ CAD-CABG x 3 vessels, hypertension, chronic back pain who is having increasing shortness of breath with dyspnea on exertion worsening since 2/24 morning. He c/o orthopnea .,DOE, mild peripheral edema.Uncertain about weight gain.No fevers, chills, nausea, vomiting. No recent illnesses or pneumonia. In the ED troponin flat 18> 20, elevated BNP 961, fairly stable CBC CMP negative for flu and influenza COVID.  Had CT chest and x-ray: Moderate right and small to moderate left pleural effusion, left pleural effusion partially loculated and tracks anteriorly, cardiomegaly with mild pulmonary edema, mild dilatation of the ascending aorta maximum dimension 4.1 cm annual imaging suggested. Patient was admitted for acute exacerbation of CHF   Subjective: Patient seen and examined, reports he feels much improved today, he has chronic lower leg edema more on the left side and this has gotten worse from baseline.  Also on 2 L nasal cannula normally not on oxygen at home   Assessment and Plan: Principal Problem:   Acute exacerbation of CHF (congestive heart failure) (HCC) Active Problems:   Atrial fibrillation (HCC)   CAD (coronary artery disease)   Chronic anticoagulation   Hypertension   Presence of cardiac pacemaker   S/P CABG x 3   Moderate aortic regurgitation   Hyperlipidemia  Acute exacerbation of CHF, unknown lvef: No previous echocardiogram available in system.Elevated BNP, imaging with pleural effusion pulmonary edema. Obtain/update echo.  Cardiology consult in a.m. overall patient feeling much better, continue IV diuresis 40 mg twice daily, continue her digoxin/metoprolol. Monitor strict I/O,daly weight, electrolytes, Cont salt and fluid restricted diet.GDMT:pending echo.Net IO Since Admission: -2,395 mL [09/15/22 0957]  Filed Weights   09/14/22 1020  09/14/22 1711 09/15/22 0531  Weight: 68 kg 74.3 kg 73 kg    CAD with CABG x 3 continue aspirin 81, Lipitor, metoprolol A-fib: On Coumadin pharmacy managing, inr is supratherapeutic.  Continue digoxin metoprolol, monitor INR. S/P pacemaker in place Hypertension BP stable on metoprolol losartan  DVT prophylaxis:  Code Status:   Code Status: Full Code Family Communication: plan of care discussed with patient at bedside. Patient status is: ONPATIENT  because of congestive heart failure exacerbation Level of care: Telemetry   Dispo: The patient is from: HOME            Anticipated disposition: HOME ~2 DAYS  Objective: Vitals last 24 hrs: Vitals:   09/14/22 2116 09/15/22 0041 09/15/22 0531 09/15/22 0531  BP: (!) 150/77 130/65 134/63 134/63  Pulse: 65 65 65 64  Resp: (!) '22 20 20 20  '$ Temp: 98.1 F (36.7 C) 97.6 F (36.4 C) (!) 97.5 F (36.4 C) (!) 97.5 F (36.4 C)  TempSrc: Oral Oral Oral Oral  SpO2: 100% 95% 98% 99%  Weight:    73 kg  Height:       Weight change:   Physical Examination: General exam: alert awake, older than stated age HEENT:Oral mucosa moist, Ear/Nose WNL grossly Respiratory system: bilaterally DIMINISHED BS, no use of accessory muscle Cardiovascular system: S1 & S2 +, No JVD. Gastrointestinal system: Abdomen soft,NT,ND, BS+ Nervous System:Alert, awake, moving extremities. Extremities: LE edema + LE- compression stocking in place ,distal peripheral pulses palpable.  Skin: No rashes,no icterus. MSK: Normal muscle bulk,tone, power  Medications reviewed:  Scheduled Meds:  aspirin EC  81 mg Oral Daily   atorvastatin  80 mg Oral QPM   digoxin  125 mcg Oral Daily  dorzolamide-timolol  1 drop Both Eyes BID   ezetimibe  10 mg Oral Daily   fluticasone  2 spray Each Nare Daily   furosemide  40 mg Intravenous BID   irbesartan  150 mg Oral Daily   loratadine  10 mg Oral Daily   metoprolol succinate  50 mg Oral Daily   montelukast  10 mg Oral Daily    pantoprazole  40 mg Oral q morning   potassium chloride  20 mEq Oral BID   Warfarin - Pharmacist Dosing Inpatient   Does not apply q1600   Continuous Infusions:    Diet Order             Diet Heart Room service appropriate? Yes; Fluid consistency: Thin  Diet effective now                   Intake/Output Summary (Last 24 hours) at 09/15/2022 0957 Last data filed at 09/15/2022 0641 Gross per 24 hour  Intake 480 ml  Output 2875 ml  Net -2395 ml   Net IO Since Admission: -2,395 mL [09/15/22 0957]  Wt Readings from Last 3 Encounters:  09/15/22 73 kg  01/15/21 68 kg  12/03/11 75.2 kg     Unresulted Labs (From admission, onward)     Start     Ordered   09/15/22 [id number]  Basic metabolic panel  Daily,   R      09/14/22 1709          Data Reviewed: I have personally reviewed following labs and imaging studies CBC: Recent Labs  Lab 09/14/22 1025 09/15/22 0459  WBC 10.1 9.6  NEUTROABS 8.5*  --   HGB 13.8 14.3  HCT 41.6 43.4  MCV 91.6 91.9  PLT 216 [id number]   Basic Metabolic Panel: Recent Labs  Lab 09/14/22 1025 09/15/22 0459  NA 134* 136  K 4.3 3.9  CL 103 103  CO2 24 27  GLUCOSE 117* 99  BUN 14 16  CREATININE 0.95 1.07  CALCIUM 8.4* 8.5*   GFR: Estimated Creatinine Clearance: 54 mL/min (by C-G formula based on SCr of 1.07 mg/dL). Liver Function Tests: Recent Labs  Lab 09/14/22 1025  AST 29  ALT 21  ALKPHOS 50  BILITOT 1.1  PROT 6.7  ALBUMIN 3.5   Recent Labs  Lab 09/14/22 1830 09/15/22 0459  INR 5.0* 4.4*   Recent Results (from the past 240 hour(s))  Resp panel by RT-PCR (RSV, Flu A&B, Covid) Anterior Nasal Swab     Status: None   Collection Time: 09/14/22 10:35 AM   Specimen: Anterior Nasal Swab  Result Value Ref Range Status   SARS Coronavirus 2 by RT PCR NEGATIVE NEGATIVE Final    Comment: (NOTE) SARS-CoV-2 target nucleic acids are NOT DETECTED.  The SARS-CoV-2 RNA is generally detectable in upper respiratory specimens during the acute  phase of infection. The lowest concentration of SARS-CoV-2 viral copies this assay can detect is 138 copies/mL. A negative result does not preclude SARS-Cov-2 infection and should not be used as the sole basis for treatment or other patient management decisions. A negative result may occur with  improper specimen collection/handling, submission of specimen other than nasopharyngeal swab, presence of viral mutation(s) within the areas targeted by this assay, and inadequate number of viral copies(<138 copies/mL). A negative result must be combined with clinical observations, patient history, and epidemiological information. The expected result is Negative.  Fact Sheet for Patients:  EntrepreneurPulse.com.au  Fact Sheet for Healthcare Providers:  IncredibleEmployment.be  This test is no t yet approved or cleared by the [place] and  has been authorized for detection and/or diagnosis of SARS-CoV-2 by FDA under an Emergency Use Authorization (EUA). This EUA will remain  in effect (meaning this test can be used) for the duration of the COVID-19 declaration under Section 564(b)(1) of the Act, 21 U.S.C.section 360bbb-3(b)(1), unless the authorization is terminated  or revoked sooner.       Influenza A by PCR NEGATIVE NEGATIVE Final   Influenza B by PCR NEGATIVE NEGATIVE Final    Comment: (NOTE) The Xpert Xpress SARS-CoV-2/FLU/RSV plus assay is intended as an aid in the diagnosis of influenza from Nasopharyngeal swab specimens and should not be used as a sole basis for treatment. Nasal washings and aspirates are unacceptable for Xpert Xpress SARS-CoV-2/FLU/RSV testing.  Fact Sheet for Patients: EntrepreneurPulse.com.au  Fact Sheet for Healthcare Providers: IncredibleEmployment.be  This test is not yet approved or cleared by the [place] FDA and has been authorized for detection and/or diagnosis of  SARS-CoV-2 by FDA under an Emergency Use Authorization (EUA). This EUA will remain in effect (meaning this test can be used) for the duration of the COVID-19 declaration under Section 564(b)(1) of the Act, 21 U.S.C. section 360bbb-3(b)(1), unless the authorization is terminated or revoked.     Resp Syncytial Virus by PCR NEGATIVE NEGATIVE Final    Comment: (NOTE) Fact Sheet for Patients: EntrepreneurPulse.com.au  Fact Sheet for Healthcare Providers: IncredibleEmployment.be  This test is not yet approved or cleared by the [place] FDA and has been authorized for detection and/or diagnosis of SARS-CoV-2 by FDA under an Emergency Use Authorization (EUA). This EUA will remain in effect (meaning this test can be used) for the duration of the COVID-19 declaration under Section 564(b)(1) of the Act, 21 U.S.C. section 360bbb-3(b)(1), unless the authorization is terminated or revoked.  Performed at [facility], [address] [place],  [address]     Antimicrobials: Anti-infectives (From admission, onward)    None      Culture/Microbiology No results found for: "SDES", "SPECREQUEST", "CULT", "REPTSTATUS"  Other culture-see note  Radiology Studies: CT Chest W Contrast  Result Date: 09/14/2022 CLINICAL DATA:  Abnormal xray - lung nodule, >= 1 cm EXAM: CT CHEST WITH CONTRAST TECHNIQUE: Multidetector CT imaging of the chest was performed during intravenous contrast administration. RADIATION DOSE REDUCTION: This exam was performed according to the departmental dose-optimization program which includes automated exposure control, adjustment of the mA and/or kV according to patient size and/or use of iterative reconstruction technique. CONTRAST:  55m OMNIPAQUE IOHEXOL 300 MG/ML  SOLN COMPARISON:  Chest radiograph earlier today. FINDINGS: Cardiovascular: Left-sided pacemaker with lead tips in the right atrium and ventricle. The heart is enlarged.  Prior median sternotomy and CABG with calcifications of the native coronary arteries. 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 No aortic dissection or acute aortic findings. There is no central pulmonary embolus. No pericardial effusion. Mediastinum/Nodes: Few calcified mediastinal lymph nodes typical of prior granulomatous disease. No noncalcified adenopathy. There is a small hiatal hernia. Lungs/Pleura: Moderate right and small to moderate left pleural effusion. The left pleural effusion is partially loculated and tracks anteriorly and into the inter lobar fissure. Pleural fluid accounts for the radiographic appearance of mid lung opacity. Mild smooth septal thickening suggestive of pulmonary edema. There is background emphysema with bronchial thickening. No suspicious pulmonary nodule. Upper Abdomen: Gallstone. Simple cyst in the left lobe of the liver, needs no further follow-up.  Small to moderate-sized hiatal hernia. Musculoskeletal: Prior median sternotomy. Moderate right glenohumeral osteoarthritis. There are no acute or suspicious osseous abnormalities. No chest wall soft tissue abnormalities. IMPRESSION: 1. Moderate right and small to moderate left pleural effusions. The left pleural effusion is partially loculated and tracks anteriorly and into the inter lobar fissure. This accounts for the radiographic appearance of mid lung opacity. 2. Cardiomegaly with mild pulmonary edema. 3. 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 No aortic dissection or acute aortic findings. Recommend annual imaging followup by CTA or MRA. This recommendation follows 2010 ACCF/AHA/AATS/ACR/ASA/SCA/SCAI/SIR/STS/SVM Guidelines for the Diagnosis and Management of Patients with Thoracic Aortic Disease. Circulation. 2010; [id number]. Aortic aneurysm NOS (ICD10-I71.9) 4. Small to moderate-sized hiatal hernia. 5. Cholelithiasis. 6. Emphysema.  Bronchial thickening.  Aortic Atherosclerosis (ICD10-I70.0) and Emphysema (ICD10-J43.9). Electronically Signed   By: Keith Rake M.D.   On: 09/14/2022 15:13   DG Chest Port 1 View  Result Date: 09/14/2022 CLINICAL DATA:  Chest pain EXAM: PORTABLE CHEST 1 VIEW COMPARISON:  Chest x-rays dated 12/03/2011 and 12/01/2008 FINDINGS: LEFT chest wall pacemaker/ICD apparatus appears grossly stable in position. Median sternotomy wires appear intact and stable alignment. Cardiomegaly. Increased interstitial markings bilaterally. More confluent opacity is seen at the periphery of the LEFT perihilar lung, suspected effusion or thickening at the LEFT lung fissure. Probable small bilateral pleural effusions. No pneumothorax is seen. IMPRESSION: 1. Coarse interstitial markings bilaterally, suggesting CHF/volume overload, alternatively chronic interstitial lung disease. 2. More confluent opacity at the periphery of the LEFT perihilar lung, suspected effusion or thickening at the LEFT lung fissure. However, consider chest CT to exclude consolidating pneumonia or neoplastic mass. 3. Probable small bilateral pleural effusions. 4. Cardiomegaly. Electronically Signed   By: Franki Cabot M.D.   On: 09/14/2022 10:50     LOS: 1 day   Antonieta Pert, MD [facility]  09/15/2022, 9:57 AM

## 2022-09-16 DIAGNOSIS — Z950 Presence of cardiac pacemaker: Secondary | ICD-10-CM | POA: Diagnosis not present

## 2022-09-16 DIAGNOSIS — I340 Nonrheumatic mitral (valve) insufficiency: Secondary | ICD-10-CM | POA: Diagnosis not present

## 2022-09-16 DIAGNOSIS — I350 Nonrheumatic aortic (valve) stenosis: Secondary | ICD-10-CM

## 2022-09-16 DIAGNOSIS — I5023 Acute on chronic systolic (congestive) heart failure: Secondary | ICD-10-CM | POA: Diagnosis not present

## 2022-09-16 LAB — BASIC METABOLIC PANEL
Anion gap: 10 (ref 5–15)
BUN: 18 mg/dL (ref 8–23)
CO2: 26 mmol/L (ref 22–32)
Calcium: 8.4 mg/dL — ABNORMAL LOW (ref 8.9–10.3)
Chloride: 99 mmol/L (ref 98–111)
Creatinine, Ser: 1.07 mg/dL (ref 0.61–1.24)
GFR, Estimated: >60 mL/min (ref >60)
Glucose, Bld: 110 mg/dL — ABNORMAL HIGH (ref 70–99)
Potassium: 3.6 mmol/L (ref 3.5–5.1)
Sodium: 135 mmol/L (ref 135–145)

## 2022-09-16 LAB — PROTIME-INR
INR: 1.4 — ABNORMAL HIGH (ref 0.8–1.2)
Prothrombin Time: 17.4 seconds — ABNORMAL HIGH (ref 11.4–15.2)

## 2022-09-16 LAB — DIGOXIN LEVEL: Digoxin Level: 0.4 ng/mL — ABNORMAL LOW (ref 0.8–2.0)

## 2022-09-16 MED ORDER — WARFARIN SODIUM 2.5 MG PO TABS: 2.5000 mg | ORAL_TABLET | Freq: Once | ORAL | Status: DC

## 2022-09-16 MED ORDER — SPIRONOLACTONE 12.5 MG HALF TABLET
12.5000 mg | ORAL_TABLET | Freq: Every day | ORAL | Status: DC
  Administered 2022-09-16 – 2022-09-17 (×2): 12.5 mg via ORAL
  Filled 2022-09-16 (×2): qty 1

## 2022-09-16 MED ORDER — WARFARIN SODIUM 2.5 MG PO TABS
5.0000 mg | ORAL_TABLET | Freq: Once | ORAL | Status: AC
  Administered 2022-09-16: 5 mg via ORAL
  Filled 2022-09-16: qty 2

## 2022-09-16 MED ORDER — SENNOSIDES-DOCUSATE SODIUM 8.6-50 MG PO TABS
1.0000 | ORAL_TABLET | Freq: Every day | ORAL | Status: DC
  Administered 2022-09-16: 1 via ORAL
  Filled 2022-09-16: qty 1

## 2022-09-16 NOTE — Progress Notes (Signed)
Pt slept through the night with no c/o pain. Pt had 360 mL of fluids PO during the night. Pt 71.169 kg this a.m.

## 2022-09-16 NOTE — Progress Notes (Signed)
PROGRESS NOTE SHAYD BERGKAMP  [id number] DOB: August 04, 1940 DOA: 09/14/2022 PCP: Ninfa Meeker, MD   Brief Narrative/Hospital Course: 82 y.o.M W/ CAD-CABG x 3 vessels, hypertension, chronic back pain who is having increasing shortness of breath with dyspnea on exertion worsening since 2/24 morning. He c/o orthopnea .,DOE, mild peripheral edema.Uncertain about weight gain.No fevers, chills, nausea, vomiting. No recent illnesses or pneumonia. In the ED troponin flat 18> 20, elevated BNP 961, fairly stable CBC CMP negative for flu and influenza COVID.  Had CT chest and x-ray: Moderate right and small to moderate left pleural effusion, left pleural effusion partially loculated and tracks anteriorly, cardiomegaly with mild pulmonary edema, mild dilatation of the ascending aorta maximum dimension 4.1 cm annual imaging suggested. Patient was admitted for acute exacerbation of CHF> placed on IV Lasix, echocardiogram obtained showed EF 40-45%, shows regional wall motion abnormalities, indeterminate diastolic parameters, no prior echo to compare   Subjective: Seen and examined this morning reports he feels much better Overnight patient has been afebrile.  Electrolytes are stable Urine output 2575 His weight at 156 pounds previously 163 on admission    Assessment and Plan: Principal Problem:   Acute exacerbation of CHF (congestive heart failure) (HCC) Active Problems:   Atrial fibrillation (HCC)   CAD (coronary artery disease)   Chronic anticoagulation   Hypertension   Presence of cardiac pacemaker   S/P CABG x 3   Moderate aortic regurgitation   Hyperlipidemia  Acute exacerbation of CHF with decreased LVEF:No previous echocardiogram available in system.Elevated BNP, imaging with pleural effusion pulmonary edema.echocardiogram obtained showed EF 40-45%,shows regional wall motion abnormalities, indeterminate diastolic parameters, no prior echo to compare. Will consult cardio. Continue IV diuresis 40  mg twice daily, continue her digoxin/metoprolol. Monitor strict I/O,daly weight, electrolytes, Cont salt and fluid restricted diet.GDMT: Per cardiology pending consult todayNet IO Since Admission: -3,890 mL [09/16/22 1027]  Filed Weights   09/14/22 1711 09/15/22 0531 09/16/22 0359  Weight: 74.3 kg 73 kg 71.2 kg    CAD with CABG x 3 no chest pain, continue aspirin 81, Lipitor, metoprolol A-fib: On Coumadin pharmacy managing, inr supratherapeutic. INR at 1.4 today-pharmacy dosing , continue digoxin metoprolol, monitor INR. Recent Labs  Lab 09/14/22 1830 09/15/22 0459 09/16/22 0937  INR 5.0* 4.4* 1.4*    S/P pacemaker in place Hypertension BP stable on metoprolol losartan  DVT prophylaxis:  Code Status:   Code Status: Full Code Family Communication: plan of care discussed with patient at bedside. Patient status is: ONPATIENT  because of congestive heart failure exacerbation Level of care: Telemetry   Dispo: The patient is from: HOME            Anticipated disposition: HOME ~1day Objective: Vitals last 24 hrs: Vitals:   09/15/22 0531 09/15/22 2147 09/16/22 0359 09/16/22 1012  BP: 134/63 (!) 141/69 (!) 118/98 135/71  Pulse: 64 66 64 64  Resp: '20 18 20 18  '$ Temp: (!) 97.5 F (36.4 C) 98.7 F (37.1 C) 97.9 F (36.6 C) 97.6 F (36.4 C)  TempSrc: Oral  Oral Oral  SpO2: 99% 96% 97% 98%  Weight: 73 kg  71.2 kg   Height:       Weight change: 3.17 kg  Physical Examination: General exam: AAox3, weak,older appearing HEENT:Oral mucosa moist, Ear/Nose WNL grossly, dentition normal. Respiratory system: bilaterally clear BS, no use of accessory muscle Cardiovascular system: S1 & S2 +, regular rate. Gastrointestinal system: Abdomen soft, NT,ND,BS+ Nervous System:Alert, awake, moving extremities and grossly nonfocal Extremities: LE ankle edema +  mre on LLE> RLE Skin: No rashes,no icterus. MSK: Normal muscle bulk,tone, power.   Medications reviewed:  Scheduled Meds:  aspirin EC  81  mg Oral Daily   atorvastatin  80 mg Oral QPM   digoxin  125 mcg Oral Daily   dorzolamide-timolol  1 drop Both Eyes BID   ezetimibe  10 mg Oral Daily   fluticasone  2 spray Each Nare Daily   furosemide  40 mg Intravenous BID   irbesartan  150 mg Oral Daily   loratadine  10 mg Oral Daily   metoprolol succinate  50 mg Oral Daily   montelukast  10 mg Oral Daily   pantoprazole  40 mg Oral q morning   potassium chloride  20 mEq Oral BID   Warfarin - Pharmacist Dosing Inpatient   Does not apply q1600  Continuous Infusions:   Diet Order             Diet Heart Room service appropriate? Yes; Fluid consistency: Thin  Diet effective now                   Intake/Output Summary (Last 24 hours) at 09/16/2022 1027 Last data filed at 09/16/2022 [id number] Gross per 24 hour  Intake 1080 ml  Output 2575 ml  Net -1495 ml   Net IO Since Admission: -3,890 mL [09/16/22 1027]  Wt Readings from Last 3 Encounters:  09/16/22 71.2 kg  01/15/21 68 kg  12/03/11 75.2 kg     Unresulted Labs (From admission, onward)     Start     Ordered   09/16/22 0758  Protime-INR  Daily,   R      09/16/22 0757   09/15/22 [id number]  Basic metabolic panel  Daily,   R      09/14/22 1709          Data Reviewed: I have personally reviewed following labs and imaging studies CBC: Recent Labs  Lab 09/14/22 1025 09/15/22 0459  WBC 10.1 9.6  NEUTROABS 8.5*  --   HGB 13.8 14.3  HCT 41.6 43.4  MCV 91.6 91.9  PLT 216 [id number]   Basic Metabolic Panel: Recent Labs  Lab 09/14/22 1025 09/15/22 0459 09/16/22 0435  NA 134* 136 135  K 4.3 3.9 3.6  CL 103 103 99  CO2 '24 27 26  '$ GLUCOSE 117* 99 110*  BUN '14 16 18  '$ CREATININE 0.95 1.07 1.07  CALCIUM 8.4* 8.5* 8.4*   GFR: Estimated Creatinine Clearance: 52.7 mL/min (by C-G formula based on SCr of 1.07 mg/dL). Liver Function Tests: Recent Labs  Lab 09/14/22 1025  AST 29  ALT 21  ALKPHOS 50  BILITOT 1.1  PROT 6.7  ALBUMIN 3.5   Recent Labs  Lab 09/14/22 1830  09/15/22 0459 09/16/22 0937  INR 5.0* 4.4* 1.4*   Recent Results (from the past 240 hour(s))  Resp panel by RT-PCR (RSV, Flu A&B, Covid) Anterior Nasal Swab     Status: None   Collection Time: 09/14/22 10:35 AM   Specimen: Anterior Nasal Swab  Result Value Ref Range Status   SARS Coronavirus 2 by RT PCR NEGATIVE NEGATIVE Final    Comment: (NOTE) SARS-CoV-2 target nucleic acids are NOT DETECTED.  The SARS-CoV-2 RNA is generally detectable in upper respiratory specimens during the acute phase of infection. The lowest concentration of SARS-CoV-2 viral copies this assay can detect is 138 copies/mL. A negative result does not preclude SARS-Cov-2 infection and should not be used as the sole basis for treatment  or other patient management decisions. A negative result may occur with  improper specimen collection/handling, submission of specimen other than nasopharyngeal swab, presence of viral mutation(s) within the areas targeted by this assay, and inadequate number of viral copies(<138 copies/mL). A negative result must be combined with clinical observations, patient history, and epidemiological information. The expected result is Negative.  Fact Sheet for Patients:  EntrepreneurPulse.com.au  Fact Sheet for Healthcare Providers:  IncredibleEmployment.be  This test is no t yet approved or cleared by the [place] FDA and  has been authorized for detection and/or diagnosis of SARS-CoV-2 by FDA under an Emergency Use Authorization (EUA). This EUA will remain  in effect (meaning this test can be used) for the duration of the COVID-19 declaration under Section 564(b)(1) of the Act, 21 U.S.C.section 360bbb-3(b)(1), unless the authorization is terminated  or revoked sooner.       Influenza A by PCR NEGATIVE NEGATIVE Final   Influenza B by PCR NEGATIVE NEGATIVE Final    Comment: (NOTE) The Xpert Xpress SARS-CoV-2/FLU/RSV plus assay is intended as  an aid in the diagnosis of influenza from Nasopharyngeal swab specimens and should not be used as a sole basis for treatment. Nasal washings and aspirates are unacceptable for Xpert Xpress SARS-CoV-2/FLU/RSV testing.  Fact Sheet for Patients: EntrepreneurPulse.com.au  Fact Sheet for Healthcare Providers: IncredibleEmployment.be  This test is not yet approved or cleared by the [place] FDA and has been authorized for detection and/or diagnosis of SARS-CoV-2 by FDA under an Emergency Use Authorization (EUA). This EUA will remain in effect (meaning this test can be used) for the duration of the COVID-19 declaration under Section 564(b)(1) of the Act, 21 U.S.C. section 360bbb-3(b)(1), unless the authorization is terminated or revoked.     Resp Syncytial Virus by PCR NEGATIVE NEGATIVE Final    Comment: (NOTE) Fact Sheet for Patients: EntrepreneurPulse.com.au  Fact Sheet for Healthcare Providers: IncredibleEmployment.be  This test is not yet approved or cleared by the [place] FDA and has been authorized for detection and/or diagnosis of SARS-CoV-2 by FDA under an Emergency Use Authorization (EUA). This EUA will remain in effect (meaning this test can be used) for the duration of the COVID-19 declaration under Section 564(b)(1) of the Act, 21 U.S.C. section 360bbb-3(b)(1), unless the authorization is terminated or revoked.  Performed at [facility], [address] [place], [place] [address]     Antimicrobials: Anti-infectives (From admission, onward)    None      Culture/Microbiology No results found for: "SDES", "SPECREQUEST", "CULT", "REPTSTATUS"  Other culture-see note  Radiology Studies: ECHOCARDIOGRAM COMPLETE  Result Date: 09/15/2022    ECHOCARDIOGRAM REPORT   Patient Name:   TAREQ FERRAR Date of Exam: 09/15/2022 Medical Rec #:  [id number]      Height:       71.0 in Accession #:     [id number]     Weight:       160.9 lb Date of Birth:  05/30/1941      BSA:          1.923 m Patient Age:    75 years       BP:           134/63 mmHg Patient Gender: M              HR:           65 bpm. Exam Location:  [facility] Procedure: 3D Echo, 2D Echo, Cardiac Doppler, Color Doppler and Intracardiac  Opacification Agent Indications:    I50.40* Unspecified combined systolic (congestive) and diastolic                 (congestive) heart failure  History:        Patient has no prior history of Echocardiogram examinations.                 CHF, Prior CABG, Abnormal ECG and Pacemaker, Aortic Valve                 Disease, Arrythmias:Atrial Fibrillation;                 Signs/Symptoms:Shortness of Breath and Edema.  Sonographer:    Roseanna Rainbow RDCS Referring Phys: [place]  1. Left ventricular ejection fraction, by estimation, is 40 to 45%. The left ventricle has mildly decreased function. The left ventricle demonstrates regional wall motion abnormalities (see scoring diagram/findings for description). There is mild concentric left ventricular hypertrophy. Left ventricular diastolic parameters are indeterminate.  2. Right ventricular systolic function is normal. The right ventricular size is normal. There is normal pulmonary artery systolic pressure. The estimated right ventricular systolic pressure is Q000111Q mmHg.  3. The mitral valve is degenerative, posterior leaflet restricted. Moderate mitral valve regurgitation.  4. Tricuspid valve regurgitation is mild to moderate.  5. The aortic valve is tricuspid. There is moderate calcification of the aortic valve. Aortic valve regurgitation is moderate. Mild aortic valve stenosis. Aortic valve mean gradient measures 12.5 mmHg. Dimentionless index 0.40.  6. The inferior vena cava is normal in size with <50% respiratory variability, suggesting right atrial pressure of 8 mmHg. Comparison(s): No prior Echocardiogram. FINDINGS  Left Ventricle: Left  ventricular ejection fraction, by estimation, is 40 to 45%. The left ventricle has mildly decreased function. The left ventricle demonstrates regional wall motion abnormalities. Definity contrast agent was given IV to delineate the left ventricular endocardial borders. The left ventricular internal cavity size was normal in size. There is mild concentric left ventricular hypertrophy. Left ventricular diastolic parameters are indeterminate.  LV Wall Scoring: The mid inferolateral segment and apex are akinetic. The antero-lateral wall, entire inferior wall, basal inferolateral segment, apical lateral segment, and apical septal segment are hypokinetic. The entire anterior wall, anterior septum, mid inferoseptal segment, and basal inferoseptal segment are normal. Right Ventricle: The right ventricular size is normal. No increase in right ventricular wall thickness. Right ventricular systolic function is normal. There is normal pulmonary artery systolic pressure. The tricuspid regurgitant velocity is 2.58 m/s, and  with an assumed right atrial pressure of 8 mmHg, the estimated right ventricular systolic pressure is Q000111Q mmHg. Left Atrium: Left atrial size was normal in size. Right Atrium: Right atrial size was normal in size. Pericardium: There is no evidence of pericardial effusion. Mitral Valve: The mitral valve is degenerative in appearance. There is mild calcification of the mitral valve leaflet(s). Mild mitral annular calcification. Moderate mitral valve regurgitation. MV peak gradient, 13.7 mmHg. The mean mitral valve gradient is 4.0 mmHg. Tricuspid Valve: The tricuspid valve is grossly normal. Tricuspid valve regurgitation is mild to moderate. Aortic Valve: The aortic valve is tricuspid. There is moderate calcification of the aortic valve. There is mild aortic valve annular calcification. Aortic valve regurgitation is moderate. Aortic regurgitation PHT measures 538 msec. Mild aortic stenosis is present. Aortic  valve mean gradient measures 12.5 mmHg. Aortic valve peak gradient measures 23.8 mmHg. Aortic valve area, by VTI measures 1.64 cm. Pulmonic Valve: The pulmonic valve was grossly  normal. Pulmonic valve regurgitation is trivial. Aorta: The aortic root is normal in size and structure. Venous: The inferior vena cava is normal in size with less than 50% respiratory variability, suggesting right atrial pressure of 8 mmHg. IAS/Shunts: No atrial level shunt detected by color flow Doppler. Additional Comments: A device lead is visualized.  LEFT VENTRICLE PLAX 2D LVIDd:         5.20 cm      Diastology LVIDs:         3.90 cm      LV e' medial:    4.88 cm/s LV PW:         1.10 cm      LV E/e' medial:  34.6 LV IVS:        1.00 cm      LV e' lateral:   3.50 cm/s LVOT diam:     2.30 cm      LV E/e' lateral: 48.3 LV SV:         74 LV SV Index:   38 LVOT Area:     4.15 cm  LV Volumes (MOD) LV vol d, MOD A2C: 167.0 ml LV vol d, MOD A4C: 170.0 ml LV vol s, MOD A2C: 91.0 ml LV vol s, MOD A4C: 108.0 ml LV SV MOD A2C:     76.0 ml LV SV MOD A4C:     170.0 ml LV SV MOD BP:      67.1 ml RIGHT VENTRICLE            IVC RV S prime:     7.14 cm/s  IVC diam: 2.10 cm TAPSE (M-mode): 1.4 cm LEFT ATRIUM             Index        RIGHT ATRIUM           Index LA diam:        4.40 cm 2.29 cm/m   RA Area:     16.60 cm LA Vol (A2C):   88.8 ml 46.19 ml/m  RA Volume:   48.20 ml  25.07 ml/m LA Vol (A4C):   60.2 ml 31.31 ml/m LA Biplane Vol: 77.7 ml 40.42 ml/m  AORTIC VALVE AV Area (Vmax):    1.69 cm AV Area (Vmean):   1.55 cm AV Area (VTI):     1.64 cm AV Vmax:           244.00 cm/s AV Vmean:          162.000 cm/s AV VTI:            0.450 m AV Peak Grad:      23.8 mmHg AV Mean Grad:      12.5 mmHg LVOT Vmax:         99.00 cm/s LVOT Vmean:        60.400 cm/s LVOT VTI:          0.178 m LVOT/AV VTI ratio: 0.40 AI PHT:            538 msec  AORTA Ao Root diam: 3.40 cm Ao Asc diam:  3.65 cm MITRAL VALVE                  TRICUSPID VALVE MV Area (PHT):  4.80 cm       TR Peak grad:   26.6 mmHg MV Area VTI:   1.93 cm       TR Vmax:        258.00 cm/s MV Peak grad:  13.7 mmHg MV Mean grad:  4.0 mmHg       SHUNTS MV Vmax:       1.85 m/s       Systemic VTI:  0.18 m MV Vmean:      82.0 cm/s      Systemic Diam: 2.30 cm MV Decel Time: 158 msec MR Peak grad:    91.4 mmHg MR Mean grad:    49.0 mmHg MR Vmax:         478.00 cm/s MR Vmean:        324.0 cm/s MR PISA:         1.57 cm MR PISA Eff ROA: 13 mm MR PISA Radius:  0.50 cm MV E velocity: 169.00 cm/s Rozann Lesches MD Electronically signed by Rozann Lesches MD Signature Date/Time: 09/15/2022/11:32:42 AM    Final    CT Chest W Contrast  Result Date: 09/14/2022 CLINICAL DATA:  Abnormal xray - lung nodule, >= 1 cm EXAM: CT CHEST WITH CONTRAST TECHNIQUE: Multidetector CT imaging of the chest was performed during intravenous contrast administration. RADIATION DOSE REDUCTION: This exam was performed according to the departmental dose-optimization program which includes automated exposure control, adjustment of the mA and/or kV according to patient size and/or use of iterative reconstruction technique. CONTRAST:  73m OMNIPAQUE IOHEXOL 300 MG/ML  SOLN COMPARISON:  Chest radiograph earlier today. FINDINGS: Cardiovascular: Left-sided pacemaker with lead tips in the right atrium and ventricle. The heart is enlarged. Prior median sternotomy and CABG with calcifications of the native coronary arteries. 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 No aortic dissection or acute aortic findings. There is no central pulmonary embolus. No pericardial effusion. Mediastinum/Nodes: Few calcified mediastinal lymph nodes typical of prior granulomatous disease. No noncalcified adenopathy. There is a small hiatal hernia. Lungs/Pleura: Moderate right and small to moderate left pleural effusion. The left pleural effusion is partially loculated and tracks anteriorly and into the inter lobar fissure. Pleural  fluid accounts for the radiographic appearance of mid lung opacity. Mild smooth septal thickening suggestive of pulmonary edema. There is background emphysema with bronchial thickening. No suspicious pulmonary nodule. Upper Abdomen: Gallstone. Simple cyst in the left lobe of the liver, needs no further follow-up. Small to moderate-sized hiatal hernia. Musculoskeletal: Prior median sternotomy. Moderate right glenohumeral osteoarthritis. There are no acute or suspicious osseous abnormalities. No chest wall soft tissue abnormalities. IMPRESSION: 1. Moderate right and small to moderate left pleural effusions. The left pleural effusion is partially loculated and tracks anteriorly and into the inter lobar fissure. This accounts for the radiographic appearance of mid lung opacity. 2. Cardiomegaly with mild pulmonary edema. 3. 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 No aortic dissection or acute aortic findings. Recommend annual imaging followup by CTA or MRA. This recommendation follows 2010 ACCF/AHA/AATS/ACR/ASA/SCA/SCAI/SIR/STS/SVM Guidelines for the Diagnosis and Management of Patients with Thoracic Aortic Disease. Circulation. 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 Small to moderate-sized hiatal hernia. 5. Cholelithiasis. 6. Emphysema.  Bronchial thickening. Aortic Atherosclerosis (ICD10-I70.0) and Emphysema (ICD10-J43.9). Electronically Signed   By: MKeith RakeM.D.   On: 09/14/2022 15:13   DG Chest Port 1 View  Result Date: 09/14/2022 CLINICAL DATA:  Chest pain EXAM: PORTABLE CHEST 1 VIEW COMPARISON:  Chest x-rays dated 12/03/2011 and 12/01/2008 FINDINGS: LEFT chest wall pacemaker/ICD apparatus appears grossly stable in position. Median sternotomy wires appear intact and stable alignment. Cardiomegaly. Increased interstitial markings bilaterally. More confluent opacity is seen at the periphery of the  LEFT perihilar lung, suspected effusion or thickening  at the LEFT lung fissure. Probable small bilateral pleural effusions. No pneumothorax is seen. IMPRESSION: 1. Coarse interstitial markings bilaterally, suggesting CHF/volume overload, alternatively chronic interstitial lung disease. 2. More confluent opacity at the periphery of the LEFT perihilar lung, suspected effusion or thickening at the LEFT lung fissure. However, consider chest CT to exclude consolidating pneumonia or neoplastic mass. 3. Probable small bilateral pleural effusions. 4. Cardiomegaly. Electronically Signed   By: Franki Cabot M.D.   On: 09/14/2022 10:50     LOS: 2 days   Antonieta Pert, MD [facility]  09/16/2022, 10:27 AM

## 2022-09-16 NOTE — Consult Note (Signed)
[facility] [place] for warfarin  Indication: atrial fibrillation  No Known Allergies  Patient Measurements: Height: '5\' 11"'$  (180.3 cm) Weight: 71.2 kg (156 lb 14.4 oz) IBW/kg (Calculated) : 75.3   Vital Signs: Temp: 97.6 F (36.4 C) (02/26 1012) Temp Source: Oral (02/26 1012) BP: 135/71 (02/26 1012) Pulse Rate: 64 (02/26 1012)  Labs: Recent Labs    09/14/22 1025 09/14/22 1205 09/14/22 1830 09/15/22 0459 09/16/22 0435 09/16/22 0937  HGB 13.8  --   --  14.3  --   --   HCT 41.6  --   --  43.4  --   --   PLT 216  --   --  241  --   --   LABPROT  --   --  46.0* 41.9*  --  17.4*  INR  --   --  5.0* 4.4*  --  1.4*  CREATININE 0.95  --   --  1.07 1.07  --   TROPONINIHS 18* 20*  --   --   --   --     Estimated Creatinine Clearance: 52.7 mL/min (by C-G formula based on SCr of 1.07 mg/dL).   Medical History: Past Medical History:  Diagnosis Date   Dysrhythmia     dr Janith L[place]  in [place]   Glaucoma    Hypertension    Myocardial infarction [facility])    2005   Assessment: 82 y.o. 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  Patient having increasing shortness of breath with dyspnea on exertion worsening since this morning. Pharmacy managing warfarin regimen for Afib.   Home regimen: '5mg'$  daily.    INR elevated on admit at 5.0, 2.'5mg'$  of vitamin k given 2/24. INR has trended down and now below range at 1.4 this morning. CBC has remained stable. No bleeding issues noted. Will restart warfarin today.   Goal of Therapy:  INR 2-3 Monitor platelets by anticoagulation protocol: Yes   Plan:  Warfarin '5mg'$  tonight, based on response in am he may need a 1-2 days of boosted dosing Daily INR  Erin Hearing PharmD., BCPS Clinical Pharmacist 09/16/2022 11:05 AM

## 2022-09-16 NOTE — Progress Notes (Signed)
   09/16/22 1430  ReDS Vest / Clip  Station Marker C  Ruler Value 32  ReDS Value Range < 36  ReDS Actual Value 26

## 2022-09-16 NOTE — Progress Notes (Signed)
Mobility Specialist Progress Note:    09/16/22 1400  Mobility  Activity Ambulated with assistance in hallway  Level of Assistance Contact guard assist, steadying assist  Assistive Device Front wheel walker  Distance Ambulated (ft) 200 ft  Activity Response Tolerated well  Mobility Referral Yes  $Mobility charge 1 Mobility   Pt was agreeable for mobility session. Tolerated well, asx throughout. Returned pt to EOB with wife in room, all needs met, call bell in reach.   Royetta Crochet Mobility Specialist Please contact via Solicitor or  Rehab office at [phone]

## 2022-09-16 NOTE — TOC Initial Note (Signed)
Transition of Care [facility]) - Initial/Assessment Note    Patient Details  Name: [name] MRN: [id number] Date of Birth: [birth year]  [facility]) CM/SW Contact:    Salome Arnt, [place] Phone Number: 09/16/2022, 10:34 AM  Clinical Narrative:  Pt admitted due to acute CHF. TOC received consult for CHF screening. Assessment completed with pt. Pt reports he lives with his wife. He is independent with ADLs and still drives. Pt plans to return home when medically stable.  CHF screening completed. Pt indicates he was diagnosed with CHF many years ago. He weighs himself daily and follows a heart healthy diet. Pt is followed by cardiology and takes his medications as prescribed. Discussed home health and pt declines at this time. Will order Living Well with CHF book. Will monitor for O2 needs at d/c.                Expected Discharge Plan: Home/Self Care Barriers to Discharge: Continued Medical Work up   Patient Goals and CMS Choice Patient states their goals for this hospitalization and ongoing recovery are:: return home   Choice offered to / list presented to : Patient [place] ownership interest in [facility].provided to::  (n/a)    Expected Discharge Plan and Services In-house Referral: Clinical Social Work     Living arrangements for the past 2 months: Single Family Home                           HH Arranged: Refused HH          Prior Living Arrangements/Services Living arrangements for the past 2 months: Single Family Home Lives with:: Spouse Patient language and need for interpreter reviewed:: Yes Do you feel safe going back to the place where you live?: Yes      Need for Family Participation in Patient Care: No (Comment)   Current home services: DME (cane) Criminal Activity/Legal Involvement Pertinent to Current Situation/Hospitalization: No - Comment as needed  Activities of Daily Living Home Assistive Devices/Equipment: Cane  (specify quad or straight) ADL Screening (condition at time of admission) Patient's cognitive ability adequate to safely complete daily activities?: Yes Is the patient deaf or have difficulty hearing?: No Does the patient have difficulty seeing, even when wearing glasses/contacts?: Yes Does the patient have difficulty concentrating, remembering, or making decisions?: No Patient able to express need for assistance with ADLs?: Yes Does the patient have difficulty dressing or bathing?: No Independently performs ADLs?: Yes (appropriate for developmental age) Does the patient have difficulty walking or climbing stairs?: Yes Weakness of Legs: Both Weakness of Arms/Hands: None  Permission Sought/Granted                  Emotional Assessment     Affect (typically observed): Appropriate Orientation: : Oriented to Self, Oriented to Place, Oriented to  Time, Oriented to Situation Alcohol / Substance Use: Not Applicable Psych Involvement: No (comment)  Admission diagnosis:  Acute exacerbation of CHF (congestive heart failure) (HCC) 0000000 Systolic congestive heart failure, unspecified HF chronicity (HCC) [I50.20] Patient Active Problem List   Diagnosis Date Noted   Acute exacerbation of CHF (congestive heart failure) ([place]) 09/14/2022   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   Chronic back pain 09/14/2022   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   CAD (coronary artery disease) 10/16/2021   Chronic anticoagulation 10/16/2021   Hypertension 10/16/2021   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/2021   Hyperlipidemia 10/16/2021  Atrial fibrillation ([place]) 09/01/2012   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

## 2022-09-16 NOTE — Consult Note (Addendum)
Cardiology Consultation   Patient ID: [name] MRN: [id number]; DOB: 28-Sep-1940  Admit date: 09/14/2022 Date of Consult: 09/16/2022  PCP:  Timothy Meeker, MD   [facility] Providers Cardiologist:New to [facility]. Follows with [name] [phone])  Patient Profile:   [name] is a 82 y.o. male with a hx of PAD, CAD status post CABG in 2004 with subsequent stenting, atrial fibrillation/flutter on warfarin, L4-L5 transforaminal lumbar fusion 2013, SSS s/p MDT pacemaker 2012, glaucoma, grade 1 diastolic dysfunction, mild LVH, moderate aortic regurgitation, use who is being seen 09/16/2022 for the evaluation of CHF at the request of Timothy. Lupita Anderson.  History of Present Illness:   [name] follows with Timothy. Janith Anderson with cardiology clinic in [place].  Notes are unavailable to review.  He has a history of CAD status post acute MI and CABG with Anderson to LAD, SVG to D1, SVG to PDA in 2004.  Notes suggest possible subsequent stenting in 2014, but patient reports no further stenting. He reports subsequent stress tests that were normal.  Has a history of A-fib/flutter on warfarin. Denies previous cardioversion. History of sick sinus syndrome status post pacemaker in 2012. He was recently referred to [facility] for battery change out. He has a history of PAD.  Angiogram 09/28/2021 showed 100% left SFA occlusion and was referred to [facility] for PVI.  Patient presented to the [place] on 09/14/2022 for shortness of breath. The patient reported a week prior he had an episode of chest pain and SOB and went to the [facility] ER, but was later discharged. After that visit, he noted progressive SOB with some lower leg edema. He also noted some weakness. No further chest pain.   The ER blood pressure 170/77, pulse 70, afebrile, respiratory rate 27, 98% O2.  Labs showed sodium 134, blood glucose 117, BNP 961, serum creatinine 0.95, BUN 14, WBC 10.1, hemoglobin 13.8.  Respiratory panel  negative. High-sensitivity troponin 18, 20.  EKG showed A-sensed V- paced rhythm. Chest x-ray showed interstitial markings suggesting CHF volume overload versus chronic interstitial lung disease.  Possible effusion on the left side, small bilateral pleural effusions.  Chest CTA showed moderate right and small to moderate left pleural effusions, mild pulmonary edema, mild dilation of the ascending aorta. The patient was given IV lasix and admitted for further work-up.   Past Medical History:  Diagnosis Date   Dysrhythmia     Timothy [name]  in [place]   Glaucoma    Hypertension    Myocardial infarction [facility])    2005    Past Surgical History:  Procedure Laterality Date   CORONARY ARTERY BYPASS GRAFT     2005   HERNIA REPAIR     INSERT / REPLACE / REMOVE PACEMAKER     [place]   Timothy Ashley Akin     Home Medications:  Prior to Admission medications   Medication Sig Start Date End Date Taking? Authorizing Provider  Ascorbic Acid 100 MG CHEW Chew 1,000 mg by mouth daily.   Yes [provider]  aspirin EC 81 MG tablet Take 81 mg by mouth daily.   Yes [provider]  atorvastatin (LIPITOR) 80 MG tablet Take 80 mg by mouth daily.   Yes [provider]  digoxin (LANOXIN) 0.125 MG tablet Take 125 mcg by mouth daily.   Yes [provider]  dorzolamide-timolol (COSOPT) 22.3-6.8 MG/ML ophthalmic solution Place 1 drop into both eyes 2 (two) times daily.  Yes [provider]  ezetimibe (ZETIA) 10 MG tablet Take 10 mg by mouth daily.   Yes [provider]  fexofenadine (ALLEGRA) 180 MG tablet Take 180 mg by mouth daily.   Yes [provider]  fluticasone (FLONASE) 50 MCG/ACT nasal spray Place into both nostrils.   Yes [provider]  meclizine (ANTIVERT) 25 MG tablet Take 1 tablet (25 mg total) by mouth 3 (three) times daily as needed for dizziness. 01/15/21  Yes Fredia Sorrow, MD  metoprolol succinate (TOPROL-XL) 50 MG  24 hr tablet Take 50 mg by mouth daily. 08/29/22  Yes [provider]  montelukast (SINGULAIR) 10 MG tablet Take 10 mg by mouth daily.   Yes [provider]  Multiple Vitamins-Minerals (PRESERVISION AREDS) TABS Take by mouth.   Yes [provider]  nitroGLYCERIN (NITROSTAT) 0.4 MG SL tablet Place 0.4 mg under the tongue every 5 (five) minutes as needed. Chest pain.   Yes [provider]  olmesartan (BENICAR) 20 MG tablet Take 20 mg by mouth daily.   Yes [provider]  pantoprazole (PROTONIX) 40 MG tablet Take 40 mg by mouth every morning.   Yes [provider]  PERIOGARD 0.12 % solution SMARTSIG:By Mouth 08/13/22  Yes [provider]  warfarin (COUMADIN) 5 MG tablet Take 5 mg by mouth daily.   Yes [provider]  losartan (COZAAR) 100 MG tablet Take 100 mg by mouth daily. Patient not taking: Reported on 09/14/2022    [provider]    Inpatient Medications: Scheduled Meds:  aspirin EC  81 mg Oral Daily   atorvastatin  80 mg Oral QPM   digoxin  125 mcg Oral Daily   dorzolamide-timolol  1 drop Both Eyes BID   ezetimibe  10 mg Oral Daily   fluticasone  2 spray Each Nare Daily   furosemide  40 mg Intravenous BID   irbesartan  150 mg Oral Daily   loratadine  10 mg Oral Daily   metoprolol succinate  50 mg Oral Daily   montelukast  10 mg Oral Daily   pantoprazole  40 mg Oral q morning   potassium chloride  20 mEq Oral BID   Warfarin - Pharmacist Dosing Inpatient   Does not apply q1600   Continuous Infusions:  PRN Meds: albuterol, ondansetron **OR** ondansetron (ZOFRAN) IV, polyethylene glycol  Allergies:   No Known Allergies  Social History:   Social History   Socioeconomic History   Marital status: Married    Spouse name: Not on file   Number of children: Not on file   Years of education: Not on file   Highest education level: Not on file  Occupational History   Not on file  Tobacco Use    Smoking status: Never   Smokeless tobacco: Never  Vaping Use   Vaping Use: Never used  Substance and Sexual Activity   Alcohol use: No   Drug use: No   Sexual activity: Yes  Other Topics Concern   Not on file  Social History Narrative   Not on file   Social Determinants of Health   Financial Resource Strain: Not on file  Food Insecurity: No Food Insecurity (09/14/2022)   Hunger Vital Sign    Worried About Running Out of Food in the Last Year: Never true    Ran Out of Food in the Last Year: Never true  Transportation Needs: No Transportation Needs (09/14/2022)   PRAPARE - Hydrologist (Medical):  No    Lack of Transportation (Non-Medical): No  Physical Activity: Not on file  Stress: Not on file  Social Connections: Not on file  Intimate Partner Violence: Not At Risk (09/14/2022)   Humiliation, Afraid, Rape, and Kick questionnaire    Fear of Current or Ex-Partner: No    Emotionally Abused: No    Physically Abused: No    Sexually Abused: No    Family History:   History reviewed. No pertinent family history.   ROS:  Please see the history of present illness.   All other ROS reviewed and negative.     Physical Exam/Data:   Vitals:   09/15/22 0531 09/15/22 0531 09/15/22 2147 09/16/22 0359  BP: 134/63 134/63 (!) 141/69 (!) 118/98  Pulse: 65 64 66 64  Resp: '20 20 18 20  '$ Temp: (!) 97.5 F (36.4 C) (!) 97.5 F (36.4 C) 98.7 F (37.1 C) 97.9 F (36.6 C)  TempSrc: Oral Oral  Oral  SpO2: 98% 99% 96% 97%  Weight:  73 kg  71.2 kg  Height:        Intake/Output Summary (Last 24 hours) at 09/16/2022 0839 Last data filed at 09/16/2022 [id number] Gross per 24 hour  Intake 1080 ml  Output 2575 ml  Net -1495 ml      09/16/2022    3:59 AM 09/15/2022    5:31 AM 09/14/2022    5:11 PM  Last 3 Weights  Weight (lbs) 156 lb 14.4 oz 160 lb 15 oz 163 lb 12.8 oz  Weight (kg) 71.169 kg 73 kg 74.299 kg     Body mass index is 21.88 kg/m.  General:  Well  nourished, well developed, in no acute distress HEENT: normal Neck: no JVD Vascular: No carotid bruits; Distal pulses 2+ bilaterally Cardiac:  normal S1, S2; RRR; no murmur  Lungs:  clear to auscultation bilaterally, no wheezing, rhonchi or rales  Abd: soft, nontender, no hepatomegaly  Ext: no edema Musculoskeletal:  No deformities, BUE and BLE strength normal and equal Skin: warm and dry  Neuro:  CNs 2-12 intact, no focal abnormalities noted Psych:  Normal affect   EKG:  The EKG was personally reviewed and demonstrates:  Asensed, V paced, 64bpm, PVCs Telemetry:  Telemetry was personally reviewed and demonstrates:  A sensed, V paced  Relevant CV Studies:  Echo 08/2022 1. Left ventricular ejection fraction, by estimation, is 40 to 45%. The  left ventricle has mildly decreased function. The left ventricle  demonstrates regional wall motion abnormalities (see scoring  diagram/findings for description). There is mild  concentric left ventricular hypertrophy. Left ventricular diastolic  parameters are indeterminate.   2. Right ventricular systolic function is normal. The right ventricular  size is normal. There is normal pulmonary artery systolic pressure. The  estimated right ventricular systolic pressure is Q000111Q mmHg.   3. The mitral valve is degenerative, posterior leaflet restricted.  Moderate mitral valve regurgitation.   4. Tricuspid valve regurgitation is mild to moderate.   5. The aortic valve is tricuspid. There is moderate calcification of the  aortic valve. Aortic valve regurgitation is moderate. Mild aortic valve  stenosis. Aortic valve mean gradient measures 12.5 mmHg. Dimentionless  index 0.40.   6. The inferior vena cava is normal in size with <50% respiratory  variability, suggesting right atrial pressure of 8 mmHg.   Comparison(s): No prior Echocardiogram.    Laboratory Data:  High Sensitivity Troponin:   Recent Labs  Lab 09/14/22 1025 09/14/22 1205   TROPONINIHS 18* 20*  Chemistry Recent Labs  Lab 09/14/22 1025 09/15/22 0459 09/16/22 0435  NA 134* 136 135  K 4.3 3.9 3.6  CL 103 103 99  CO2 '24 27 26  '$ GLUCOSE 117* 99 110*  BUN '14 16 18  '$ CREATININE 0.95 1.07 1.07  CALCIUM 8.4* 8.5* 8.4*  GFRNONAA >60 >60 >60  ANIONGAP '7 6 10    '$ Recent Labs  Lab 09/14/22 1025  PROT 6.7  ALBUMIN 3.5  AST 29  ALT 21  ALKPHOS 50  BILITOT 1.1   Lipids No results for input(s): "CHOL", "TRIG", "HDL", "LABVLDL", "LDLCALC", "CHOLHDL" in the last 168 hours.  Hematology Recent Labs  Lab 09/14/22 1025 09/15/22 0459  WBC 10.1 9.6  RBC 4.54 4.72  HGB 13.8 14.3  HCT 41.6 43.4  MCV 91.6 91.9  MCH 30.4 30.3  MCHC 33.2 32.9  RDW 13.4 13.6  PLT 216 241   Thyroid No results for input(s): "TSH", "FREET4" in the last 168 hours.  BNP Recent Labs  Lab 09/14/22 1336  BNP 961.0*    DDimer No results for input(s): "DDIMER" in the last 168 hours.   Radiology/Studies:  ECHOCARDIOGRAM COMPLETE  Result Date: 09/15/2022    ECHOCARDIOGRAM REPORT   Patient Name:   IRWIN RUBIO Date of Exam: 09/15/2022 Medical Rec #:  [id number]      Height:       71.0 in Accession #:    [id number]     Weight:       160.9 lb Date of Birth:  04-Jun-1941      BSA:          1.923 m Patient Age:    89 years       BP:           134/63 mmHg Patient Gender: M              HR:           65 bpm. Exam Location:  [facility] Procedure: 3D Echo, 2D Echo, Cardiac Doppler, Color Doppler and Intracardiac            Opacification Agent Indications:    I50.40* Unspecified combined systolic (congestive) and diastolic                 (congestive) heart failure  History:        Patient has no prior history of Echocardiogram examinations.                 CHF, Prior CABG, Abnormal ECG and Pacemaker, Aortic Valve                 Disease, Arrythmias:Atrial Fibrillation;                 Signs/Symptoms:Shortness of Breath and Edema.  Sonographer:    Roseanna Rainbow RDCS Referring Phys: [place]  1. Left ventricular ejection fraction, by estimation, is 40 to 45%. The left ventricle has mildly decreased function. The left ventricle demonstrates regional wall motion abnormalities (see scoring diagram/findings for description). There is mild concentric left ventricular hypertrophy. Left ventricular diastolic parameters are indeterminate.  2. Right ventricular systolic function is normal. The right ventricular size is normal. There is normal pulmonary artery systolic pressure. The estimated right ventricular systolic pressure is Q000111Q mmHg.  3. The mitral valve is degenerative, posterior leaflet restricted. Moderate mitral valve regurgitation.  4. Tricuspid valve regurgitation is mild to moderate.  5. The aortic valve is tricuspid. There is moderate calcification  of the aortic valve. Aortic valve regurgitation is moderate. Mild aortic valve stenosis. Aortic valve mean gradient measures 12.5 mmHg. Dimentionless index 0.40.  6. The inferior vena cava is normal in size with <50% respiratory variability, suggesting right atrial pressure of 8 mmHg. Comparison(s): No prior Echocardiogram. FINDINGS  Left Ventricle: Left ventricular ejection fraction, by estimation, is 40 to 45%. The left ventricle has mildly decreased function. The left ventricle demonstrates regional wall motion abnormalities. Definity contrast agent was given IV to delineate the left ventricular endocardial borders. The left ventricular internal cavity size was normal in size. There is mild concentric left ventricular hypertrophy. Left ventricular diastolic parameters are indeterminate.  LV Wall Scoring: The mid inferolateral segment and apex are akinetic. The antero-lateral wall, entire inferior wall, basal inferolateral segment, apical lateral segment, and apical septal segment are hypokinetic. The entire anterior wall, anterior septum, mid inferoseptal segment, and basal inferoseptal segment are normal. Right Ventricle: The  right ventricular size is normal. No increase in right ventricular wall thickness. Right ventricular systolic function is normal. There is normal pulmonary artery systolic pressure. The tricuspid regurgitant velocity is 2.58 m/s, and  with an assumed right atrial pressure of 8 mmHg, the estimated right ventricular systolic pressure is Q000111Q mmHg. Left Atrium: Left atrial size was normal in size. Right Atrium: Right atrial size was normal in size. Pericardium: There is no evidence of pericardial effusion. Mitral Valve: The mitral valve is degenerative in appearance. There is mild calcification of the mitral valve leaflet(s). Mild mitral annular calcification. Moderate mitral valve regurgitation. MV peak gradient, 13.7 mmHg. The mean mitral valve gradient is 4.0 mmHg. Tricuspid Valve: The tricuspid valve is grossly normal. Tricuspid valve regurgitation is mild to moderate. Aortic Valve: The aortic valve is tricuspid. There is moderate calcification of the aortic valve. There is mild aortic valve annular calcification. Aortic valve regurgitation is moderate. Aortic regurgitation PHT measures 538 msec. Mild aortic stenosis is present. Aortic valve mean gradient measures 12.5 mmHg. Aortic valve peak gradient measures 23.8 mmHg. Aortic valve area, by VTI measures 1.64 cm. Pulmonic Valve: The pulmonic valve was grossly normal. Pulmonic valve regurgitation is trivial. Aorta: The aortic root is normal in size and structure. Venous: The inferior vena cava is normal in size with less than 50% respiratory variability, suggesting right atrial pressure of 8 mmHg. IAS/Shunts: No atrial level shunt detected by color flow Doppler. Additional Comments: A device lead is visualized.  LEFT VENTRICLE PLAX 2D LVIDd:         5.20 cm      Diastology LVIDs:         3.90 cm      LV e' medial:    4.88 cm/s LV PW:         1.10 cm      LV E/e' medial:  34.6 LV IVS:        1.00 cm      LV e' lateral:   3.50 cm/s LVOT diam:     2.30 cm      LV  E/e' lateral: 48.3 LV SV:         74 LV SV Index:   38 LVOT Area:     4.15 cm  LV Volumes (MOD) LV vol d, MOD A2C: 167.0 ml LV vol d, MOD A4C: 170.0 ml LV vol s, MOD A2C: 91.0 ml LV vol s, MOD A4C: 108.0 ml LV SV MOD A2C:     76.0 ml LV SV MOD A4C:  170.0 ml LV SV MOD BP:      67.1 ml RIGHT VENTRICLE            IVC RV S prime:     7.14 cm/s  IVC diam: 2.10 cm TAPSE (M-mode): 1.4 cm LEFT ATRIUM             Index        RIGHT ATRIUM           Index LA diam:        4.40 cm 2.29 cm/m   RA Area:     16.60 cm LA Vol (A2C):   88.8 ml 46.19 ml/m  RA Volume:   48.20 ml  25.07 ml/m LA Vol (A4C):   60.2 ml 31.31 ml/m LA Biplane Vol: 77.7 ml 40.42 ml/m  AORTIC VALVE AV Area (Vmax):    1.69 cm AV Area (Vmean):   1.55 cm AV Area (VTI):     1.64 cm AV Vmax:           244.00 cm/s AV Vmean:          162.000 cm/s AV VTI:            0.450 m AV Peak Grad:      23.8 mmHg AV Mean Grad:      12.5 mmHg LVOT Vmax:         99.00 cm/s LVOT Vmean:        60.400 cm/s LVOT VTI:          0.178 m LVOT/AV VTI ratio: 0.40 AI PHT:            538 msec  AORTA Ao Root diam: 3.40 cm Ao Asc diam:  3.65 cm MITRAL VALVE                  TRICUSPID VALVE MV Area (PHT): 4.80 cm       TR Peak grad:   26.6 mmHg MV Area VTI:   1.93 cm       TR Vmax:        258.00 cm/s MV Peak grad:  13.7 mmHg MV Mean grad:  4.0 mmHg       SHUNTS MV Vmax:       1.85 m/s       Systemic VTI:  0.18 m MV Vmean:      82.0 cm/s      Systemic Diam: 2.30 cm MV Decel Time: 158 msec MR Peak grad:    91.4 mmHg MR Mean grad:    49.0 mmHg MR Vmax:         478.00 cm/s MR Vmean:        324.0 cm/s MR PISA:         1.57 cm MR PISA Eff ROA: 13 mm MR PISA Radius:  0.50 cm MV E velocity: 169.00 cm/s Rozann Lesches MD Electronically signed by Rozann Lesches MD Signature Date/Time: 09/15/2022/11:32:42 AM    Final    CT Chest W Contrast  Result Date: 09/14/2022 CLINICAL DATA:  Abnormal xray - lung nodule, >= 1 cm EXAM: CT CHEST WITH CONTRAST TECHNIQUE: Multidetector CT imaging of  the chest was performed during intravenous contrast administration. RADIATION DOSE REDUCTION: This exam was performed according to the departmental dose-optimization program which includes automated exposure control, adjustment of the mA and/or kV according to patient size and/or use of iterative reconstruction technique. CONTRAST:  17m OMNIPAQUE IOHEXOL 300 MG/ML  SOLN COMPARISON:  Chest radiograph earlier today. FINDINGS: Cardiovascular: Left-sided pacemaker  with lead tips in the right atrium and ventricle. The heart is enlarged. Prior median sternotomy and CABG with calcifications of the native coronary arteries. 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 No aortic dissection or acute aortic findings. There is no central pulmonary embolus. No pericardial effusion. Mediastinum/Nodes: Few calcified mediastinal lymph nodes typical of prior granulomatous disease. No noncalcified adenopathy. There is a small hiatal hernia. Lungs/Pleura: Moderate right and small to moderate left pleural effusion. The left pleural effusion is partially loculated and tracks anteriorly and into the inter lobar fissure. Pleural fluid accounts for the radiographic appearance of mid lung opacity. Mild smooth septal thickening suggestive of pulmonary edema. There is background emphysema with bronchial thickening. No suspicious pulmonary nodule. Upper Abdomen: Gallstone. Simple cyst in the left lobe of the liver, needs no further follow-up. Small to moderate-sized hiatal hernia. Musculoskeletal: Prior median sternotomy. Moderate right glenohumeral osteoarthritis. There are no acute or suspicious osseous abnormalities. No chest wall soft tissue abnormalities. IMPRESSION: 1. Moderate right and small to moderate left pleural effusions. The left pleural effusion is partially loculated and tracks anteriorly and into the inter lobar fissure. This accounts for the radiographic appearance of mid lung opacity. 2.  Cardiomegaly with mild pulmonary edema. 3. 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 No aortic dissection or acute aortic findings. Recommend annual imaging followup by CTA or MRA. This recommendation follows 2010 ACCF/AHA/AATS/ACR/ASA/SCA/SCAI/SIR/STS/SVM Guidelines for the Diagnosis and Management of Patients with Thoracic Aortic Disease. Circulation. 2010; [id number]. Aortic aneurysm NOS (ICD10-I71.9) 4. Small to moderate-sized hiatal hernia. 5. Cholelithiasis. 6. Emphysema.  Bronchial thickening. Aortic Atherosclerosis (ICD10-I70.0) and Emphysema (ICD10-J43.9). Electronically Signed   By: Keith Rake M.D.   On: 09/14/2022 15:13   DG Chest Port 1 View  Result Date: 09/14/2022 CLINICAL DATA:  Chest pain EXAM: PORTABLE CHEST 1 VIEW COMPARISON:  Chest x-rays dated 12/03/2011 and 12/01/2008 FINDINGS: LEFT chest wall pacemaker/ICD apparatus appears grossly stable in position. Median sternotomy wires appear intact and stable alignment. Cardiomegaly. Increased interstitial markings bilaterally. More confluent opacity is seen at the periphery of the LEFT perihilar lung, suspected effusion or thickening at the LEFT lung fissure. Probable small bilateral pleural effusions. No pneumothorax is seen. IMPRESSION: 1. Coarse interstitial markings bilaterally, suggesting CHF/volume overload, alternatively chronic interstitial lung disease. 2. More confluent opacity at the periphery of the LEFT perihilar lung, suspected effusion or thickening at the LEFT lung fissure. However, consider chest CT to exclude consolidating pneumonia or neoplastic mass. 3. Probable small bilateral pleural effusions. 4. Cardiomegaly. Electronically Signed   By: Franki Cabot M.D.   On: 09/14/2022 10:50     Assessment and Plan:   Acute systolic and diastolic heart failure - patient presented with progressive SOB. He had an episode of chest/sOB one week prior - BNP 961. HS troponin 18>20 - CXR with  volume overload - PTA digoxin 0.'125mg'$  daily, Toprol '50mg'$  daily, Losartan '100mg'$  daily.  - started non IV lasix '40mg'$  BID - Net -3.8L - toprol '50mg'$  daily - Irbesartan '150mg'$  daily - digoxin '125mg'$  daily, level 0.4 - Echo this admission showed mildly reduced EF 40-45%. No prior echocardiograms for comparison, unsure if this is new - he is on 2L O2, which he is not on at baseline. Wean O2 as able - On exam, does not appear significantly volume up - kidney function is stable, may be able to switch to oral lasix tomorrow - Given chest pain/SOB and reduced EF, may need LHC. Add  GDMT for reduced EF as able  Minimally elevated troponin CAD s/p CABG and ?subsequent stenting - HS trop minimally elevated in the setting of acute heart failure - as above, he had an episdoe of chest pain one week prior to arrival - Echo showed LVEF 40-45%. No prior echo for reviews - no further chest pain reported - continue ASA, Lipitor, Zetia, Toprol - may need Cardiac cath as previously stated  SSS s/p Pacemaker - recently referred to [facility] for battery change out  Afib/flutter - warfarin per pharmacy. Has also been on ASA for CAD.  HTN - BP wnl  HLD - continue Lipitor '80mg'$  daily and Zetia  For questions or updates, please contact [place] Please consult [email or web address] for contact info under    Signed, Cadence Arlyss Repress  09/16/2022 8:39 AM   Attending note Patient seen and discussed with PA Further, I agree with her documentation. 82 yo male history of CAD with prior CABG limited details on, HTN, afib on coumadin, presented with DOE,orthopnea, LE edema. Followed by Timothy [name] in [place] [phone])   WBC 10.1 Hgb 13.8 Plt 216  K 4.3 Cr 0.95 BUN 14 BNP 961 INR 5 Trop 18-->20 CXR CHF Echo: LVEF [id number], indet diastolic, normal RV, mod MR 08/2022 CT chest: moderate right and small left pleural effusion. Pulm edema   1.Acute on chronic HFrEF [facility] echo 2013 reports LVEF 45% 08/2022  Echo: LVEF [id number], indet diastolic, normal RV, mod MR - this may be his baseline LVEF, patient believes he recalls being told this in the past.  We will request records from Timothy [name] his primary cardiolgist.   - negative 1.5 L yesterday, neg 3.9 L since admission. He is on IV lasix '40mg'$  bid, slight uptrend in Cr - medical therapy with digoxin 0.125, irbesartan 150, toprol 50. Add aldactone 12.'5mg'$  daily, likely transition ARB to [employer].  - continue IV diuresis.  - no plans for cath as inpatient, I think this may be his baseline LVEF. I think deferring to his regular outpatient cardiolgist on ischemic testing would be appropriate as no urgent indication.     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 Aortic stenosis - mild by echo, mean grad 12.5, AVA VTI 1.6  5. Afib/aflutter - no symptoms - continue current meds, he is on coumadin  6.Pacemaker -followed at [facility]  7. CAD - no acute issues   Carlyle Dolly MD

## 2022-09-17 DIAGNOSIS — I5023 Acute on chronic systolic (congestive) heart failure: Secondary | ICD-10-CM | POA: Diagnosis not present

## 2022-09-17 LAB — BASIC METABOLIC PANEL
Anion gap: 7 (ref 5–15)
BUN: 22 mg/dL (ref 8–23)
CO2: 26 mmol/L (ref 22–32)
Calcium: 8.2 mg/dL — ABNORMAL LOW (ref 8.9–10.3)
Chloride: 101 mmol/L (ref 98–111)
Creatinine, Ser: 1.04 mg/dL (ref 0.61–1.24)
GFR, Estimated: >60 mL/min (ref >60)
Glucose, Bld: 105 mg/dL — ABNORMAL HIGH (ref 70–99)
Potassium: 3.6 mmol/L (ref 3.5–5.1)
Sodium: 134 mmol/L — ABNORMAL LOW (ref 135–145)

## 2022-09-17 LAB — LIPID PANEL
Cholesterol: 103 mg/dL (ref 0–200)
HDL: 41 mg/dL (ref >40)
LDL Cholesterol: 47 mg/dL (ref 0–99)
Total CHOL/HDL Ratio: 2.5 RATIO
Triglycerides: 73 mg/dL (ref <150)
VLDL: 15 mg/dL (ref 0–40)

## 2022-09-17 LAB — PROTIME-INR
INR: 1.3 — ABNORMAL HIGH (ref 0.8–1.2)
Prothrombin Time: 16.2 seconds — ABNORMAL HIGH (ref 11.4–15.2)

## 2022-09-17 MED ORDER — FUROSEMIDE 40 MG PO TABS: 40.0000 mg | ORAL_TABLET | Freq: Every day | ORAL | 0 refills | Status: DC

## 2022-09-17 MED ORDER — EMPAGLIFLOZIN 10 MG PO TABS
10.0000 mg | ORAL_TABLET | Freq: Every day | ORAL | Status: DC
  Administered 2022-09-17: 10 mg via ORAL
  Filled 2022-09-17: qty 1

## 2022-09-17 MED ORDER — SACUBITRIL-VALSARTAN 49-51 MG PO TABS: 1.0000 | ORAL_TABLET | Freq: Two times a day (BID) | ORAL | 0 refills | Status: AC

## 2022-09-17 MED ORDER — POTASSIUM CHLORIDE CRYS ER 20 MEQ PO TBCR: 20.0000 meq | EXTENDED_RELEASE_TABLET | Freq: Every day | ORAL | 0 refills | Status: DC

## 2022-09-17 MED ORDER — WARFARIN SODIUM 2.5 MG PO TABS
5.0000 mg | ORAL_TABLET | Freq: Once | ORAL | Status: AC
  Administered 2022-09-17: 5 mg via ORAL
  Filled 2022-09-17: qty 2

## 2022-09-17 MED ORDER — SACUBITRIL-VALSARTAN 49-51 MG PO TABS: 1.0000 | ORAL_TABLET | Freq: Two times a day (BID) | ORAL | Status: DC

## 2022-09-17 MED ORDER — SPIRONOLACTONE 25 MG PO TABS: 12.5000 mg | ORAL_TABLET | Freq: Every day | ORAL | 0 refills | Status: DC

## 2022-09-17 MED ORDER — EMPAGLIFLOZIN 10 MG PO TABS: 10.0000 mg | ORAL_TABLET | Freq: Every day | ORAL | 0 refills | Status: AC

## 2022-09-17 MED ORDER — LIVING BETTER WITH HEART FAILURE BOOK: Freq: Once | Status: AC

## 2022-09-17 MED ORDER — FUROSEMIDE 40 MG PO TABS: 40.0000 mg | ORAL_TABLET | Freq: Every day | ORAL | Status: DC

## 2022-09-17 NOTE — Progress Notes (Signed)
Discharge instructions given on medications and follow up visits,patient and family verbalized understanding . Prescriptions sent to Pharmacy of choice documented on AVS. IV discontinued, catheter intact . Accompanied by staff to an awaiting vehicle.

## 2022-09-17 NOTE — Progress Notes (Signed)
SATURATION QUALIFICATIONS: (This note is used to comply with regulatory documentation for home oxygen)  Patient Saturations on Room Air at Rest = 96%  Patient Saturations on Room Air while Ambulating = 97%  Patient Saturations on 0 Liters of oxygen while Ambulating n/a  Please briefly explain why patient needs home oxygen:

## 2022-09-17 NOTE — Progress Notes (Signed)
Rounding Note    Patient Name: [name] Date of Encounter: 09/17/2022  [place] Cardiologist: Dr Reece Packer  Subjective   No complaints   Inpatient Medications    Scheduled Meds:  aspirin EC  81 mg Oral Daily   atorvastatin  80 mg Oral QPM   digoxin  125 mcg Oral Daily   dorzolamide-timolol  1 drop Both Eyes BID   ezetimibe  10 mg Oral Daily   fluticasone  2 spray Each Nare Daily   furosemide  40 mg Intravenous BID   irbesartan  150 mg Oral Daily   Living Better with Heart Failure Book   Does not apply Once   loratadine  10 mg Oral Daily   metoprolol succinate  50 mg Oral Daily   montelukast  10 mg Oral Daily   pantoprazole  40 mg Oral q morning   potassium chloride  20 mEq Oral BID   senna-docusate  1 tablet Oral QHS   spironolactone  12.5 mg Oral Daily   warfarin  5 mg Oral ONCE-1600   Warfarin - Pharmacist Dosing Inpatient   Does not apply q1600   Continuous Infusions:  PRN Meds: albuterol, ondansetron **OR** ondansetron (ZOFRAN) IV, polyethylene glycol   Vital Signs    Vitals:   09/16/22 2146 09/17/22 0348 09/17/22 0500 09/17/22 0840  BP: 109/60 125/63  137/67  Pulse: 65 67  64  Resp: '18 20  18  '$ Temp: (!) 97.5 F (36.4 C) 98.8 F (37.1 C)  98.7 F (37.1 C)  TempSrc:    Oral  SpO2: 95% 97%  98%  Weight:   71.8 kg   Height:        Intake/Output Summary (Last 24 hours) at 09/17/2022 1128 Last data filed at 09/17/2022 0900 Gross per 24 hour  Intake 960 ml  Output 1200 ml  Net -240 ml      09/17/2022    5:00 AM 09/16/2022    3:59 AM 09/15/2022    5:31 AM  Last 3 Weights  Weight (lbs) 158 lb 4.6 oz 156 lb 14.4 oz 160 lb 15 oz  Weight (kg) 71.8 kg 71.169 kg 73 kg      Telemetry    V paced - Personally Reviewed  ECG    N/a - Personally Reviewed  Physical Exam   GEN: No acute distress.   Neck: No JVD Cardiac: RRR, 2/6 systolic murmur rusb Respiratory: Clear to auscultation bilaterally. GI: Soft, nontender,  non-distended  MS: No edema; No deformity. Neuro:  Nonfocal  Psych: Normal affect   Labs    High Sensitivity Troponin:   Recent Labs  Lab 09/14/22 1025 09/14/22 1205  TROPONINIHS 18* 20*     Chemistry Recent Labs  Lab 09/14/22 1025 09/15/22 0459 09/16/22 0435 09/17/22 0419  NA 134* 136 135 134*  K 4.3 3.9 3.6 3.6  CL 103 103 99 101  CO2 '24 27 26 26  '$ GLUCOSE 117* 99 110* 105*  BUN '14 16 18 22  '$ CREATININE 0.95 1.07 1.07 1.04  CALCIUM 8.4* 8.5* 8.4* 8.2*  PROT 6.7  --   --   --   ALBUMIN 3.5  --   --   --   AST 29  --   --   --   ALT 21  --   --   --   ALKPHOS 50  --   --   --   BILITOT 1.1  --   --   --  GFRNONAA >60 >60 >60 >60  ANIONGAP '7 6 10 7    '$ Lipids  Recent Labs  Lab 09/17/22 0419  CHOL 103  TRIG 73  HDL 41  LDLCALC 47  CHOLHDL 2.5    Hematology Recent Labs  Lab 09/14/22 1025 09/15/22 0459  WBC 10.1 9.6  RBC 4.54 4.72  HGB 13.8 14.3  HCT 41.6 43.4  MCV 91.6 91.9  MCH 30.4 30.3  MCHC 33.2 32.9  RDW 13.4 13.6  PLT 216 241   Thyroid No results for input(s): "TSH", "FREET4" in the last 168 hours.  BNP Recent Labs  Lab 09/14/22 1336  BNP 961.0*    DDimer No results for input(s): "DDIMER" in the last 168 hours.   Radiology    No results found.  Cardiac Studies     Patient Profile     [name] is a 82 y.o. male with a hx of PAD, CAD status post CABG in 2004 with subsequent stenting, atrial fibrillation/flutter on warfarin, L4-L5 transforaminal lumbar fusion 2013, SSS s/p MDT pacemaker 2012, glaucoma, grade 1 diastolic dysfunction, mild LVH, moderate aortic regurgitation, use who is being seen 09/16/2022 for the evaluation of CHF at the request of Dr. Lupita Leash.   Assessment & Plan   1.Acute on chronic HFrEF [facility] echo 2013 reports LVEF 45% 08/2022 Echo: LVEF [id number], indet diastolic, normal RV, mod MR - this may be his baseline LVEF, patient believes he recalls being told this in the past.  Awaiting records from Dr Janith Lima his  primary cardiolgist.    - negative 480 mL yesterday, neg 4.4 L since admission. He is on IV lasix '40mg'$  bid, mild variation in Cr without clear trend. -reds vest yesterday 26% suggesting euvolemic -appears euvolemic, d/c IV lasix and start oral lasix '40mg'$  daily tomrrow  - medical therapy with digoxin 0.125, irbesartan 150, toprol 50, aldactone 12.'5mg'$  - change irbesartan to entresto 49/'51mg'$  bid starting tomorrow since got AM dose.  - start jardiance '10mg'$  daily.   - no plans for cath as inpatient, I think this may be his baseline LVEF. I think deferring to his regular outpatient cardiolgist on ischemic testing would be appropriate as no urgent indication.   - wean O2, if ambulated and does well can d/c today and f/u with his regular cardiologist.      2.Mitral regurgitation - moderate by echo, continue to monitor   3. 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 Aortic stenosis - mild by echo, mean grad 12.5, AVA VTI 1.6   5. Afib/aflutter - no symptoms - continue current meds, he is on coumadin   6.Pacemaker -followed at [facility]   7. CAD - no acute issues - on ASA and coumadin at home, defer to his primary cardiologist.   For questions or updates, please contact [place] Please consult [email or web address] for contact info under        Signed, Carlyle Dolly, MD  09/17/2022, 11:28 AM

## 2022-09-17 NOTE — Discharge Summary (Signed)
Physician Discharge Summary  [name] [id number] DOB: 1941-07-21 DOA: 09/14/2022  PCP: Ninfa Meeker, MD  Admit date: 09/14/2022 Discharge date: 09/17/2022 Recommendations for Outpatient Follow-up:  Follow up with PCP in 1 weeks-call for appointment Please obtain BMP/CBC in one week  Discharge Dispo: Home Discharge Condition: Stable Code Status:   Code Status: Full Code Diet recommendation:  Diet Order             Diet Heart Room service appropriate? Yes; Fluid consistency: Thin  Diet effective now                    Brief/Interim Summary: 82 y.o.M W/ CAD-CABG x 3 vessels, hypertension, chronic back pain who is having increasing shortness of breath with dyspnea on exertion worsening since 2/24 morning. He c/o orthopnea .,DOE, mild peripheral edema.Uncertain about weight gain.No fevers, chills, nausea, vomiting. No recent illnesses or pneumonia. In the ED troponin flat 18> 20, elevated BNP 961, fairly stable CBC CMP negative for flu and influenza COVID.  Had CT chest and x-ray: Moderate right and small to moderate left pleural effusion, left pleural effusion partially loculated and tracks anteriorly, cardiomegaly with mild pulmonary edema, mild dilatation of the ascending aorta maximum dimension 4.1 cm annual imaging suggested. Patient was admitted for acute exacerbation of CHF> placed on IV Lasix, echocardiogram obtained showed EF 40-45%, shows regional wall motion abnormalities, indeterminate diastolic parameters, no prior echo to compare.  Seen by cardiology-previous EF was 45% in 2013, so suspect this is his baseline LVEF no plan for ischemic evaluation deferring to patient's primary outpatient cardiology, continue don IV diuresis, adjusting GDMT-with entresto,, Jardiance, oral diuretics discontinue irbesartan continue Toprol, Aldactone.  He will be ambulated if does well on room air he will be discharged home with instruction to follow-up with his primary cardiologist.    Discharge Diagnoses:  Principal Problem:   Acute exacerbation of CHF (congestive heart failure) ([place]) Active Problems:   Atrial fibrillation (HCC)   CAD (coronary artery disease)   Chronic anticoagulation   Hypertension   Presence of cardiac pacemaker   S/P CABG x 3   Moderate aortic regurgitation   Hyperlipidemia  PROGRESS NOTE [name]  [id number] DOB: Dec 16, 1940 DOA: 09/14/2022 PCP: Ninfa Meeker, MD   Brief Narrative/Hospital Course: 82 y.o.M W/ CAD-CABG x 3 vessels, hypertension, chronic back pain who is having increasing shortness of breath with dyspnea on exertion worsening since 2/24 morning. He c/o orthopnea .,DOE, mild peripheral edema.Uncertain about weight gain.No fevers, chills, nausea, vomiting. No recent illnesses or pneumonia. In the ED troponin flat 18> 20, elevated BNP 961, fairly stable CBC CMP negative for flu and influenza COVID.  Had CT chest and x-ray: Moderate right and small to moderate left pleural effusion, left pleural effusion partially loculated and tracks anteriorly, cardiomegaly with mild pulmonary edema, mild dilatation of the ascending aorta maximum dimension 4.1 cm annual imaging suggested. Patient was admitted for acute exacerbation of CHF> placed on IV Lasix, echocardiogram obtained showed EF 40-45%, shows regional wall motion abnormalities, indeterminate diastolic parameters, no prior echo to compare.  Seen by cardiology-previous EF was 45% in 2013, so suspect this is his baseline LVEF no plan for ischemic evaluation deferring to patient's primary outpatient cardiology, continue don IV diuresis, adjusting GDMT-with entresto,, Jardiance, oral diuretics discontinue irbesartan continue Toprol, Aldactone.  He will be ambulated if does well on room air he will be discharged home with instruction to follow-up with his primary cardiologist.   Subjective: Seen and examined this  morning reports he feels much better Overnight patient has been afebrile.   Electrolytes are stable Urine output 2575 His weight at 156 pounds previously 163 on admission    Assessment and Plan: Principal Problem:   Acute exacerbation of CHF (congestive heart failure) (HCC) Active Problems:   Atrial fibrillation (HCC)   CAD (coronary artery disease)   Chronic anticoagulation   Hypertension   Presence of cardiac pacemaker   S/P CABG x 3   Moderate aortic regurgitation   Hyperlipidemia  Acute exacerbation of CHF with decreased LVEF: Seen by cardiology-previous EF was 45% in 2013, so suspect this is his baseline LVEF no plan for ischemic evaluation deferring to patient's primary outpatient cardiology, continue don IV diuresis, adjusting GDMT-with entresto,, Jardiance, oral diuretics discontinue irbesartan continue Toprol, Aldactone.  He will be ambulated if does well on room air he will be discharged home with instruction to follow-up with his primary cardiologist  CAD with CABG x 3 no chest pain, continue aspirin 81, Lipitor, metoprolol A-fib: On Coumadin pharmacy managing, inr supratherapeutic. INR at 1.3 today-pharmacy dosing > discussed with Dr. Harl Bowie- no need to bridge, continue on digoxin metoprolol, monitor INR as OP. Recent Labs  Lab 09/14/22 1830 09/15/22 0459 09/16/22 0937 09/17/22 0419  INR 5.0* 4.4* 1.4* 1.3*    S/P pacemaker in place-followed at [facility] Hypertension BP stable on metoprolol losartan  Consults: Cardiology Subjective: Seen and examined resting comfortably doing overall well.  Discharge Exam: Vitals:   09/17/22 1336 09/17/22 1426  BP:  117/67  Pulse: 64 66  Resp:  (!) 21  Temp:  (!) 97.5 F (36.4 C)  SpO2: 97% 97%   General: Pt is alert, awake, not in acute distress Cardiovascular: RRR, S1/S2 +, no rubs, no gallops Respiratory: CTA bilaterally, no wheezing, no rhonchi Abdominal: Soft, NT, ND, bowel sounds + Extremities: no edema, no cyanosis  Discharge Instructions  Discharge Instructions     (HEART FAILURE  PATIENTS) Call MD:  Anytime you have any of the following symptoms: 1) 3 pound weight gain in 24 hours or 5 pounds in 1 week 2) shortness of breath, with or without a dry hacking cough 3) swelling in the hands, feet or stomach 4) if you have to sleep on extra pillows at night in order to breathe.   Complete by: As directed    Discharge instructions   Complete by: As directed    Please call call MD or return to ER for similar or worsening recurring problem that brought you to hospital or if any fever,nausea/vomiting,abdominal pain, uncontrolled pain, chest pain,  shortness of breath or any other alarming symptoms.  Please follow-up your doctor as instructed in a week time and call the office for appointment.  Please avoid alcohol, smoking, or any other illicit substance and maintain healthy habits including taking your regular medications as prescribed.  You were cared for by a hospitalist during your hospital stay. If you have any questions about your discharge medications or the care you received while you were in the hospital after you are discharged, you can call the unit and ask to speak with the hospitalist on call if the hospitalist that took care of you is not available.  Once you are discharged, your primary care physician will handle any further medical issues. Please note that NO REFILLS for any discharge medications will be authorized once you are discharged, as it is imperative that you return to your primary care physician (or establish a relationship with a primary care  physician if you do not have one) for your aftercare needs so that they can reassess your need for medications and monitor your lab values   Increase activity slowly   Complete by: As directed       Allergies as of 09/17/2022   No Known Allergies      Medication List     STOP taking these medications    losartan 100 MG tablet Commonly known as: COZAAR   olmesartan 20 MG tablet Commonly known as: BENICAR        TAKE these medications    Ascorbic Acid 100 MG Chew Chew 1,000 mg by mouth daily.   aspirin EC 81 MG tablet Take 81 mg by mouth daily.   atorvastatin 80 MG tablet Commonly known as: LIPITOR Take 80 mg by mouth daily.   digoxin 0.125 MG tablet Commonly known as: LANOXIN Take 125 mcg by mouth daily.   dorzolamide-timolol 2-0.5 % ophthalmic solution Commonly known as: COSOPT Place 1 drop into both eyes 2 (two) times daily.   empagliflozin 10 MG Tabs tablet Commonly known as: JARDIANCE Take 1 tablet (10 mg total) by mouth daily.   ezetimibe 10 MG tablet Commonly known as: ZETIA Take 10 mg by mouth daily.   fexofenadine 180 MG tablet Commonly known as: ALLEGRA Take 180 mg by mouth daily.   fluticasone 50 MCG/ACT nasal spray Commonly known as: FLONASE Place into both nostrils.   furosemide 40 MG tablet Commonly known as: LASIX Take 1 tablet (40 mg total) by mouth daily. Start taking on: September 18, 2022   meclizine 25 MG tablet Commonly known as: ANTIVERT Take 1 tablet (25 mg total) by mouth 3 (three) times daily as needed for dizziness.   metoprolol succinate 50 MG 24 hr tablet Commonly known as: TOPROL-XL Take 50 mg by mouth daily.   montelukast 10 MG tablet Commonly known as: SINGULAIR Take 10 mg by mouth daily.   nitroGLYCERIN 0.4 MG SL tablet Commonly known as: NITROSTAT Place 0.4 mg under the tongue every 5 (five) minutes as needed. Chest pain.   pantoprazole 40 MG tablet Commonly known as: PROTONIX Take 40 mg by mouth every morning.   Periogard 0.12 % solution Generic drug: chlorhexidine SMARTSIG:By Mouth   potassium chloride SA 20 MEQ tablet Commonly known as: KLOR-CON M Take 1 tablet (20 mEq total) by mouth daily for 7 days.   PreserVision AREDS Tabs Take by mouth.   sacubitril-valsartan 49-51 MG Commonly known as: ENTRESTO Take 1 tablet by mouth 2 (two) times daily. Start taking on: September 18, 2022   spironolactone 25 MG  tablet Commonly known as: ALDACTONE Take 0.5 tablets (12.5 mg total) by mouth daily. Start taking on: September 18, 2022   warfarin 5 MG tablet Commonly known as: COUMADIN Take 5 mg by mouth daily.        Follow-up Information     Ninfa Meeker, MD Follow up in 1 week(s).   Contact information: [place] [address] [phone]                No Known Allergies  The results of significant diagnostics from this hospitalization (including imaging, microbiology, ancillary and laboratory) are listed below for reference.    Microbiology: Recent Results (from the past 240 hour(s))  Resp panel by RT-PCR (RSV, Flu A&B, Covid) Anterior Nasal Swab     Status: None   Collection Time: 09/14/22 10:35 AM   Specimen: Anterior Nasal Swab  Result Value Ref Range Status  SARS Coronavirus 2 by RT PCR NEGATIVE NEGATIVE Final    Comment: (NOTE) SARS-CoV-2 target nucleic acids are NOT DETECTED.  The SARS-CoV-2 RNA is generally detectable in upper respiratory specimens during the acute phase of infection. The lowest concentration of SARS-CoV-2 viral copies this assay can detect is 138 copies/mL. A negative result does not preclude SARS-Cov-2 infection and should not be used as the sole basis for treatment or other patient management decisions. A negative result may occur with  improper specimen collection/handling, submission of specimen other than nasopharyngeal swab, presence of viral mutation(s) within the areas targeted by this assay, and inadequate number of viral copies(<138 copies/mL). A negative result must be combined with clinical observations, patient history, and epidemiological information. The expected result is Negative.  Fact Sheet for Patients:  EntrepreneurPulse.com.au  Fact Sheet for Healthcare Providers:  IncredibleEmployment.be  This test is no t yet approved or cleared by the [place] FDA and  has been  authorized for detection and/or diagnosis of SARS-CoV-2 by FDA under an Emergency Use Authorization (EUA). This EUA will remain  in effect (meaning this test can be used) for the duration of the COVID-19 declaration under Section 564(b)(1) of the Act, 21 U.S.C.section 360bbb-3(b)(1), unless the authorization is terminated  or revoked sooner.       Influenza A by PCR NEGATIVE NEGATIVE Final   Influenza B by PCR NEGATIVE NEGATIVE Final    Comment: (NOTE) The Xpert Xpress SARS-CoV-2/FLU/RSV plus assay is intended as an aid in the diagnosis of influenza from Nasopharyngeal swab specimens and should not be used as a sole basis for treatment. Nasal washings and aspirates are unacceptable for Xpert Xpress SARS-CoV-2/FLU/RSV testing.  Fact Sheet for Patients: EntrepreneurPulse.com.au  Fact Sheet for Healthcare Providers: IncredibleEmployment.be  This test is not yet approved or cleared by the [place] FDA and has been authorized for detection and/or diagnosis of SARS-CoV-2 by FDA under an Emergency Use Authorization (EUA). This EUA will remain in effect (meaning this test can be used) for the duration of the COVID-19 declaration under Section 564(b)(1) of the Act, 21 U.S.C. section 360bbb-3(b)(1), unless the authorization is terminated or revoked.     Resp Syncytial Virus by PCR NEGATIVE NEGATIVE Final    Comment: (NOTE) Fact Sheet for Patients: EntrepreneurPulse.com.au  Fact Sheet for Healthcare Providers: IncredibleEmployment.be  This test is not yet approved or cleared by the [place] FDA and has been authorized for detection and/or diagnosis of SARS-CoV-2 by FDA under an Emergency Use Authorization (EUA). This EUA will remain in effect (meaning this test can be used) for the duration of the COVID-19 declaration under Section 564(b)(1) of the Act, 21 U.S.C. section 360bbb-3(b)(1), unless the  authorization is terminated or revoked.  Performed at [facility], [address] [place], [place] [address]     Procedures/Studies: ECHOCARDIOGRAM COMPLETE  Result Date: 09/15/2022    ECHOCARDIOGRAM REPORT   Patient Name:   [name] Date of Exam: 09/15/2022 Medical Rec #:  [id number]      Height:       71.0 in Accession #:    [id number]     Weight:       160.9 lb Date of Birth:  Aug 24, 1940      BSA:          1.923 m Patient Age:    82 years       BP:           134/63 mmHg Patient Gender: M  HR:           65 bpm. Exam Location:  [facility] Procedure: 3D Echo, 2D Echo, Cardiac Doppler, Color Doppler and Intracardiac            Opacification Agent Indications:    I50.40* Unspecified combined systolic (congestive) and diastolic                 (congestive) heart failure  History:        Patient has no prior history of Echocardiogram examinations.                 CHF, Prior CABG, Abnormal ECG and Pacemaker, Aortic Valve                 Disease, Arrythmias:Atrial Fibrillation;                 Signs/Symptoms:Shortness of Breath and Edema.  Sonographer:    Roseanna Rainbow RDCS Referring Phys: [place]  1. Left ventricular ejection fraction, by estimation, is 40 to 45%. The left ventricle has mildly decreased function. The left ventricle demonstrates regional wall motion abnormalities (see scoring diagram/findings for description). There is mild concentric left ventricular hypertrophy. Left ventricular diastolic parameters are indeterminate.  2. Right ventricular systolic function is normal. The right ventricular size is normal. There is normal pulmonary artery systolic pressure. The estimated right ventricular systolic pressure is Q000111Q mmHg.  3. The mitral valve is degenerative, posterior leaflet restricted. Moderate mitral valve regurgitation.  4. Tricuspid valve regurgitation is mild to moderate.  5. The aortic valve is tricuspid. There is moderate calcification of the aortic  valve. Aortic valve regurgitation is moderate. Mild aortic valve stenosis. Aortic valve mean gradient measures 12.5 mmHg. Dimentionless index 0.40.  6. The inferior vena cava is normal in size with <50% respiratory variability, suggesting right atrial pressure of 8 mmHg. Comparison(s): No prior Echocardiogram. FINDINGS  Left Ventricle: Left ventricular ejection fraction, by estimation, is 40 to 45%. The left ventricle has mildly decreased function. The left ventricle demonstrates regional wall motion abnormalities. Definity contrast agent was given IV to delineate the left ventricular endocardial borders. The left ventricular internal cavity size was normal in size. There is mild concentric left ventricular hypertrophy. Left ventricular diastolic parameters are indeterminate.  LV Wall Scoring: The mid inferolateral segment and apex are akinetic. The antero-lateral wall, entire inferior wall, basal inferolateral segment, apical lateral segment, and apical septal segment are hypokinetic. The entire anterior wall, anterior septum, mid inferoseptal segment, and basal inferoseptal segment are normal. Right Ventricle: The right ventricular size is normal. No increase in right ventricular wall thickness. Right ventricular systolic function is normal. There is normal pulmonary artery systolic pressure. The tricuspid regurgitant velocity is 2.58 m/s, and  with an assumed right atrial pressure of 8 mmHg, the estimated right ventricular systolic pressure is Q000111Q mmHg. Left Atrium: Left atrial size was normal in size. Right Atrium: Right atrial size was normal in size. Pericardium: There is no evidence of pericardial effusion. Mitral Valve: The mitral valve is degenerative in appearance. There is mild calcification of the mitral valve leaflet(s). Mild mitral annular calcification. Moderate mitral valve regurgitation. MV peak gradient, 13.7 mmHg. The mean mitral valve gradient is 4.0 mmHg. Tricuspid Valve: The tricuspid valve is  grossly normal. Tricuspid valve regurgitation is mild to moderate. Aortic Valve: The aortic valve is tricuspid. There is moderate calcification of the aortic valve. There is mild aortic valve annular calcification. Aortic valve regurgitation is moderate.  Aortic regurgitation PHT measures 538 msec. Mild aortic stenosis is present. Aortic valve mean gradient measures 12.5 mmHg. Aortic valve peak gradient measures 23.8 mmHg. Aortic valve area, by VTI measures 1.64 cm. Pulmonic Valve: The pulmonic valve was grossly normal. Pulmonic valve regurgitation is trivial. Aorta: The aortic root is normal in size and structure. Venous: The inferior vena cava is normal in size with less than 50% respiratory variability, suggesting right atrial pressure of 8 mmHg. IAS/Shunts: No atrial level shunt detected by color flow Doppler. Additional Comments: A device lead is visualized.  LEFT VENTRICLE PLAX 2D LVIDd:         5.20 cm      Diastology LVIDs:         3.90 cm      LV e' medial:    4.88 cm/s LV PW:         1.10 cm      LV E/e' medial:  34.6 LV IVS:        1.00 cm      LV e' lateral:   3.50 cm/s LVOT diam:     2.30 cm      LV E/e' lateral: 48.3 LV SV:         74 LV SV Index:   38 LVOT Area:     4.15 cm  LV Volumes (MOD) LV vol d, MOD A2C: 167.0 ml LV vol d, MOD A4C: 170.0 ml LV vol s, MOD A2C: 91.0 ml LV vol s, MOD A4C: 108.0 ml LV SV MOD A2C:     76.0 ml LV SV MOD A4C:     170.0 ml LV SV MOD BP:      67.1 ml RIGHT VENTRICLE            IVC RV S prime:     7.14 cm/s  IVC diam: 2.10 cm TAPSE (M-mode): 1.4 cm LEFT ATRIUM             Index        RIGHT ATRIUM           Index LA diam:        4.40 cm 2.29 cm/m   RA Area:     16.60 cm LA Vol (A2C):   88.8 ml 46.19 ml/m  RA Volume:   48.20 ml  25.07 ml/m LA Vol (A4C):   60.2 ml 31.31 ml/m LA Biplane Vol: 77.7 ml 40.42 ml/m  AORTIC VALVE AV Area (Vmax):    1.69 cm AV Area (Vmean):   1.55 cm AV Area (VTI):     1.64 cm AV Vmax:           244.00 cm/s AV Vmean:          162.000 cm/s  AV VTI:            0.450 m AV Peak Grad:      23.8 mmHg AV Mean Grad:      12.5 mmHg LVOT Vmax:         99.00 cm/s LVOT Vmean:        60.400 cm/s LVOT VTI:          0.178 m LVOT/AV VTI ratio: 0.40 AI PHT:            538 msec  AORTA Ao Root diam: 3.40 cm Ao Asc diam:  3.65 cm MITRAL VALVE                  TRICUSPID VALVE MV Area (PHT): 4.80 cm  TR Peak grad:   26.6 mmHg MV Area VTI:   1.93 cm       TR Vmax:        258.00 cm/s MV Peak grad:  13.7 mmHg MV Mean grad:  4.0 mmHg       SHUNTS MV Vmax:       1.85 m/s       Systemic VTI:  0.18 m MV Vmean:      82.0 cm/s      Systemic Diam: 2.30 cm MV Decel Time: 158 msec MR Peak grad:    91.4 mmHg MR Mean grad:    49.0 mmHg MR Vmax:         478.00 cm/s MR Vmean:        324.0 cm/s MR PISA:         1.57 cm MR PISA Eff ROA: 13 mm MR PISA Radius:  0.50 cm MV E velocity: 169.00 cm/s Rozann Lesches MD Electronically signed by Rozann Lesches MD Signature Date/Time: 09/15/2022/11:32:42 AM    Final    CT Chest W Contrast  Result Date: 09/14/2022 CLINICAL DATA:  Abnormal xray - lung nodule, >= 1 cm EXAM: CT CHEST WITH CONTRAST TECHNIQUE: Multidetector CT imaging of the chest was performed during intravenous contrast administration. RADIATION DOSE REDUCTION: This exam was performed according to the departmental dose-optimization program which includes automated exposure control, adjustment of the mA and/or kV according to patient size and/or use of iterative reconstruction technique. CONTRAST:  65m OMNIPAQUE IOHEXOL 300 MG/ML  SOLN COMPARISON:  Chest radiograph earlier today. FINDINGS: Cardiovascular: Left-sided pacemaker with lead tips in the right atrium and ventricle. The heart is enlarged. Prior median sternotomy and CABG with calcifications of the native coronary arteries. 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 No aortic dissection or acute aortic findings. There is no central pulmonary embolus. No pericardial effusion.  Mediastinum/Nodes: Few calcified mediastinal lymph nodes typical of prior granulomatous disease. No noncalcified adenopathy. There is a small hiatal hernia. Lungs/Pleura: Moderate right and small to moderate left pleural effusion. The left pleural effusion is partially loculated and tracks anteriorly and into the inter lobar fissure. Pleural fluid accounts for the radiographic appearance of mid lung opacity. Mild smooth septal thickening suggestive of pulmonary edema. There is background emphysema with bronchial thickening. No suspicious pulmonary nodule. Upper Abdomen: Gallstone. Simple cyst in the left lobe of the liver, needs no further follow-up. Small to moderate-sized hiatal hernia. Musculoskeletal: Prior median sternotomy. Moderate right glenohumeral osteoarthritis. There are no acute or suspicious osseous abnormalities. No chest wall soft tissue abnormalities. IMPRESSION: 1. Moderate right and small to moderate left pleural effusions. The left pleural effusion is partially loculated and tracks anteriorly and into the inter lobar fissure. This accounts for the radiographic appearance of mid lung opacity. 2. Cardiomegaly with mild pulmonary edema. 3. 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 No aortic dissection or acute aortic findings. Recommend annual imaging followup by CTA or MRA. This recommendation follows 2010 ACCF/AHA/AATS/ACR/ASA/SCA/SCAI/SIR/STS/SVM Guidelines for the Diagnosis and Management of Patients with Thoracic Aortic Disease. Circulation. 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 Small to moderate-sized hiatal hernia. 5. Cholelithiasis. 6. Emphysema.  Bronchial thickening. Aortic Atherosclerosis (ICD10-I70.0) and Emphysema (ICD10-J43.9). Electronically Signed   By: MKeith RakeM.D.   On: 09/14/2022 15:13   DG Chest Port 1 View  Result Date: 09/14/2022 CLINICAL DATA:  Chest pain EXAM: PORTABLE CHEST 1 VIEW COMPARISON:  Chest x-rays  dated 12/03/2011  and 12/01/2008 FINDINGS: LEFT chest wall pacemaker/ICD apparatus appears grossly stable in position. Median sternotomy wires appear intact and stable alignment. Cardiomegaly. Increased interstitial markings bilaterally. More confluent opacity is seen at the periphery of the LEFT perihilar lung, suspected effusion or thickening at the LEFT lung fissure. Probable small bilateral pleural effusions. No pneumothorax is seen. IMPRESSION: 1. Coarse interstitial markings bilaterally, suggesting CHF/volume overload, alternatively chronic interstitial lung disease. 2. More confluent opacity at the periphery of the LEFT perihilar lung, suspected effusion or thickening at the LEFT lung fissure. However, consider chest CT to exclude consolidating pneumonia or neoplastic mass. 3. Probable small bilateral pleural effusions. 4. Cardiomegaly. Electronically Signed   By: Franki Cabot M.D.   On: 09/14/2022 10:50    Labs: BNP (last 3 results) Recent Labs    09/14/22 1336  BNP [id number]*   Basic Metabolic Panel: Recent Labs  Lab 09/14/22 1025 09/15/22 0459 09/16/22 0435 09/17/22 0419  NA 134* 136 135 134*  K 4.3 3.9 3.6 3.6  CL 103 103 99 101  CO2 '24 27 26 26  '$ GLUCOSE 117* 99 110* 105*  BUN '14 16 18 22  '$ CREATININE 0.95 1.07 1.07 1.04  CALCIUM 8.4* 8.5* 8.4* 8.2*   Liver Function Tests: Recent Labs  Lab 09/14/22 1025  AST 29  ALT 21  ALKPHOS 50  BILITOT 1.1  PROT 6.7  ALBUMIN 3.5   No results for input(s): "LIPASE", "AMYLASE" in the last 168 hours. No results for input(s): "AMMONIA" in the last 168 hours. CBC: Recent Labs  Lab 09/14/22 1025 09/15/22 0459  WBC 10.1 9.6  NEUTROABS 8.5*  --   HGB 13.8 14.3  HCT 41.6 43.4  MCV 91.6 91.9  PLT 216 241   Cardiac Enzymes: No results for input(s): "CKTOTAL", "CKMB", "CKMBINDEX", "TROPONINI" in the last 168 hours. BNP: Invalid input(s): "POCBNP" CBG: No results for input(s): "GLUCAP" in the last 168 hours. D-Dimer No results  for input(s): "DDIMER" in the last 72 hours. Hgb A1c No results for input(s): "HGBA1C" in the last 72 hours. Lipid Profile Recent Labs    09/17/22 0419  CHOL 103  HDL 41  LDLCALC 47  TRIG 73  CHOLHDL 2.5   Thyroid function studies No results for input(s): "TSH", "T4TOTAL", "T3FREE", "THYROIDAB" in the last 72 hours.  Invalid input(s): "FREET3" Anemia work up No results for input(s): "VITAMINB12", "FOLATE", "FERRITIN", "TIBC", "IRON", "RETICCTPCT" in the last 72 hours. Urinalysis    Component Value Date/Time   COLORURINE YELLOW 01/15/2021 1922   APPEARANCEUR CLEAR 01/15/2021 1922   LABSPEC 1.011 01/15/2021 1922   PHURINE 6.0 01/15/2021 1922   GLUCOSEU NEGATIVE 01/15/2021 1922   HGBUR NEGATIVE 01/15/2021 1922   BILIRUBINUR NEGATIVE 01/15/2021 1922   KETONESUR 5 (A) 01/15/2021 1922   PROTEINUR NEGATIVE 01/15/2021 1922   UROBILINOGEN 0.2 12/03/2011 1402   NITRITE NEGATIVE 01/15/2021 1922   LEUKOCYTESUR NEGATIVE 01/15/2021 1922   Sepsis Labs Recent Labs  Lab 09/14/22 1025 09/15/22 0459  WBC 10.1 9.6   Microbiology Recent Results (from the past 240 hour(s))  Resp panel by RT-PCR (RSV, Flu A&B, Covid) Anterior Nasal Swab     Status: None   Collection Time: 09/14/22 10:35 AM   Specimen: Anterior Nasal Swab  Result Value Ref Range Status   SARS Coronavirus 2 by RT PCR NEGATIVE NEGATIVE Final    Comment: (NOTE) SARS-CoV-2 target nucleic acids are NOT DETECTED.  The SARS-CoV-2 RNA is generally detectable in upper respiratory specimens during the acute phase of infection. The lowest  concentration of SARS-CoV-2 viral copies this assay can detect is 138 copies/mL. A negative result does not preclude SARS-Cov-2 infection and should not be used as the sole basis for treatment or other patient management decisions. A negative result may occur with  improper specimen collection/handling, submission of specimen other than nasopharyngeal swab, presence of viral mutation(s)  within the areas targeted by this assay, and inadequate number of viral copies(<138 copies/mL). A negative result must be combined with clinical observations, patient history, and epidemiological information. The expected result is Negative.  Fact Sheet for Patients:  EntrepreneurPulse.com.au  Fact Sheet for Healthcare Providers:  IncredibleEmployment.be  This test is no t yet approved or cleared by the [place] FDA and  has been authorized for detection and/or diagnosis of SARS-CoV-2 by FDA under an Emergency Use Authorization (EUA). This EUA will remain  in effect (meaning this test can be used) for the duration of the COVID-19 declaration under Section 564(b)(1) of the Act, 21 U.S.C.section 360bbb-3(b)(1), unless the authorization is terminated  or revoked sooner.       Influenza A by PCR NEGATIVE NEGATIVE Final   Influenza B by PCR NEGATIVE NEGATIVE Final    Comment: (NOTE) The Xpert Xpress SARS-CoV-2/FLU/RSV plus assay is intended as an aid in the diagnosis of influenza from Nasopharyngeal swab specimens and should not be used as a sole basis for treatment. Nasal washings and aspirates are unacceptable for Xpert Xpress SARS-CoV-2/FLU/RSV testing.  Fact Sheet for Patients: EntrepreneurPulse.com.au  Fact Sheet for Healthcare Providers: IncredibleEmployment.be  This test is not yet approved or cleared by the [place] FDA and has been authorized for detection and/or diagnosis of SARS-CoV-2 by FDA under an Emergency Use Authorization (EUA). This EUA will remain in effect (meaning this test can be used) for the duration of the COVID-19 declaration under Section 564(b)(1) of the Act, 21 U.S.C. section 360bbb-3(b)(1), unless the authorization is terminated or revoked.     Resp Syncytial Virus by PCR NEGATIVE NEGATIVE Final    Comment: (NOTE) Fact Sheet for  Patients: EntrepreneurPulse.com.au  Fact Sheet for Healthcare Providers: IncredibleEmployment.be  This test is not yet approved or cleared by the [place] FDA and has been authorized for detection and/or diagnosis of SARS-CoV-2 by FDA under an Emergency Use Authorization (EUA). This EUA will remain in effect (meaning this test can be used) for the duration of the COVID-19 declaration under Section 564(b)(1) of the Act, 21 U.S.C. section 360bbb-3(b)(1), unless the authorization is terminated or revoked.  Performed at [facility], [address], [place], [place] [address]   Time coordinating discharge: 35 minutes  SIGNED: Antonieta Pert, MD  [facility] 09/17/2022, 2:37 PM  If 7PM-7AM, please contact night-coverage [email or web address]

## 2022-09-17 NOTE — Progress Notes (Signed)
Pt slept through the night. Vitals stable, no c/o pain noted.

## 2022-09-17 NOTE — Consult Note (Signed)
[place] for warfarin  Indication: atrial fibrillation  No Known Allergies  Patient Measurements: Height: '5\' 11"'$  (180.3 cm) Weight: 71.8 kg (158 lb 4.6 oz) IBW/kg (Calculated) : 75.3   Vital Signs: Temp: 98.8 F (37.1 C) (02/27 0348) BP: 125/63 (02/27 0348) Pulse Rate: 67 (02/27 0348)  Labs: Recent Labs    09/14/22 1025 09/14/22 1205 09/14/22 1830 09/15/22 0459 09/16/22 0435 09/16/22 0937 09/17/22 0419  HGB 13.8  --   --  14.3  --   --   --   HCT 41.6  --   --  43.4  --   --   --   PLT 216  --   --  241  --   --   --   LABPROT  --   --    < > 41.9*  --  17.4* 16.2*  INR  --   --    < > 4.4*  --  1.4* 1.3*  CREATININE 0.95  --   --  1.07 1.07  --  1.04  TROPONINIHS 18* 20*  --   --   --   --   --    < > = values in this interval not displayed.     Estimated Creatinine Clearance: 54.7 mL/min (by C-G formula based on SCr of 1.04 mg/dL).   Medical History: Past Medical History:  Diagnosis Date   Dysrhythmia     dr Janith L[place]  in [place]   Glaucoma    Hypertension    Myocardial infarction [facility])    2005   Assessment: 82 y.o. 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 Pharmacy managing warfarin regimen for Afib.   Home regimen: '5mg'$  daily.    INR elevated on admit at 5.0, 2.'5mg'$  of vitamin k given 2/24. INR has trended down and now 1.3  Goal of Therapy:  INR 2-3 Monitor platelets by anticoagulation protocol: Yes   Plan:  Warfarin '5mg'$  tonight, based on response in am he may need a 1-2 days of boosted dosing Daily INR  Margot Ables, PharmD Clinical Pharmacist 09/17/2022 8:08 AM

## 2023-12-14 ENCOUNTER — Encounter (HOSPITAL_COMMUNITY): Payer: Self-pay

## 2023-12-14 ENCOUNTER — Other Ambulatory Visit: Payer: Self-pay

## 2023-12-14 ENCOUNTER — Emergency Department (HOSPITAL_COMMUNITY)
Admission: EM | Admit: 2023-12-14 | Discharge: 2023-12-14 | Disposition: A | Discharge status: Home / Self Care | Attending: Emergency Medicine | Admitting: Emergency Medicine

## 2023-12-14 DIAGNOSIS — T887XXA Unspecified adverse effect of drug or medicament, initial encounter: Secondary | ICD-10-CM | POA: Diagnosis present

## 2023-12-14 DIAGNOSIS — Z7982 Long term (current) use of aspirin: Secondary | ICD-10-CM | POA: Insufficient documentation

## 2023-12-14 DIAGNOSIS — I10 Essential (primary) hypertension: Secondary | ICD-10-CM | POA: Diagnosis not present

## 2023-12-14 DIAGNOSIS — Z79899 Other long term (current) drug therapy: Secondary | ICD-10-CM | POA: Insufficient documentation

## 2023-12-14 DIAGNOSIS — R011 Cardiac murmur, unspecified: Secondary | ICD-10-CM | POA: Insufficient documentation

## 2023-12-14 DIAGNOSIS — Z7901 Long term (current) use of anticoagulants: Secondary | ICD-10-CM | POA: Insufficient documentation

## 2023-12-14 LAB — CBC
HCT: 46.7 % (ref 39.0–52.0)
Hemoglobin: 15.7 g/dL (ref 13.0–17.0)
MCH: 31.5 pg (ref 26.0–34.0)
MCHC: 33.6 g/dL (ref 30.0–36.0)
MCV: 93.6 fL (ref 80.0–100.0)
Platelets: 236 10*3/uL (ref 150–400)
RBC: 4.99 MIL/uL (ref 4.22–5.81)
RDW: 14.2 % (ref 11.5–15.5)
WBC: 9.1 10*3/uL (ref 4.0–10.5)
nRBC: 0 % (ref 0.0–0.2)

## 2023-12-14 LAB — DIGOXIN LEVEL: Digoxin Level: 1.1 ng/mL (ref 0.8–2.0)

## 2023-12-14 LAB — I-STAT CHEM 8, ED
BUN: 11 mg/dL (ref 8–23)
Calcium, Ion: 1.14 mmol/L — ABNORMAL LOW (ref 1.15–1.40)
Chloride: 101 mmol/L (ref 98–111)
Creatinine, Ser: 1 mg/dL (ref 0.61–1.24)
Glucose, Bld: 115 mg/dL — ABNORMAL HIGH (ref 70–99)
HCT: 48 % (ref 39.0–52.0)
Hemoglobin: 16.3 g/dL (ref 13.0–17.0)
Potassium: 4.3 mmol/L (ref 3.5–5.1)
Sodium: 138 mmol/L (ref 135–145)
TCO2: 23 mmol/L (ref 22–32)

## 2023-12-14 LAB — BASIC METABOLIC PANEL WITH GFR
Anion gap: 12 (ref 5–15)
BUN: 12 mg/dL (ref 8–23)
CO2: 24 mmol/L (ref 22–32)
Calcium: 8.7 mg/dL — ABNORMAL LOW (ref 8.9–10.3)
Chloride: 102 mmol/L (ref 98–111)
Creatinine, Ser: 0.88 mg/dL (ref 0.61–1.24)
GFR, Estimated: >60 mL/min (ref >60)
Glucose, Bld: 118 mg/dL — ABNORMAL HIGH (ref 70–99)
Potassium: 4.2 mmol/L (ref 3.5–5.1)
Sodium: 138 mmol/L (ref 135–145)

## 2023-12-14 LAB — PROTIME-INR
INR: 2.7 — ABNORMAL HIGH (ref 0.8–1.2)
Prothrombin Time: 28.5 s — ABNORMAL HIGH (ref 11.4–15.2)

## 2023-12-14 MED ORDER — MECLIZINE HCL 25 MG PO TABS: 25.0000 mg | ORAL_TABLET | Freq: Three times a day (TID) | ORAL | 0 refills | Status: AC | PRN

## 2023-12-14 NOTE — Discharge Instructions (Signed)
 Thankfully your testing was unremarkable, I suspect that all of your symptoms are coming from the scopolamine patch.  I would recommend strongly that you stop using it immediately.  Make sure that you clean the skin behind your ear when you take the patch off.  I have prescribed a meclizine  which you can use every 8 hours as needed for vertigo.  You can follow-up with your doctor in the office but rest assured that your testing here was unremarkable and reassuring.

## 2023-12-14 NOTE — ED Triage Notes (Signed)
 Pt arrived via POV from home. Started scopolamine patch 10 days ago. Itchiness and anxiety started 5 days ago. Vertigo symptoms are mild currently. Pt ambulatory with no complaints of pain. Wife present with patient.

## 2023-12-14 NOTE — ED Notes (Signed)
 ISTAT CHEM 8   Na- 138 K- 4.3 Cl- 101 iCa- 1.14 TCO2- 23 Glu- 115 Bun- 11 Crea- 1 HCT- 48 Hb- 16.3 AnGap- 20

## 2023-12-14 NOTE — ED Provider Notes (Signed)
 [place] EMERGENCY DEPARTMENT AT [facility] Provider Note   CSN: [id number] Arrival date & time: 12/14/23  0920     History  Chief Complaint  Patient presents with   Medication Reaction    [name] is a 83 y.o. male.  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  He has been wearing a scopolamine patch for approximately the last 10 days and during that timeframe he has had improvement in his vertigo but feels like he is a little bit lightheaded when he stands and is starting to feel like he is itching in his skin or feeling like his skin is coming off.  This is a new sensation for him and he has only noticed it since wearing the scopolamine patch.  He denies any visual changes, change in his speech and has no changes in his coordination.  He has felt generally weak for the last 5 days.  Denies shortness of breath, chest pain, fever, chills or diarrhea.  Home Medications Prior to Admission medications   Medication Sig Start Date End Date Taking? Authorizing Provider  Ascorbic Acid 100 MG CHEW Chew 1,000 mg by mouth daily.    [provider]  aspirin  EC 81 MG tablet Take 81 mg by mouth daily.    [provider]  atorvastatin  (LIPITOR ) 80 MG tablet Take 80 mg by mouth daily.    [provider]  digoxin  (LANOXIN ) 0.125 MG tablet Take 125 mcg by mouth daily.    [provider]  dorzolamide -timolol  (COSOPT ) 22.3-6.8 MG/ML ophthalmic solution Place 1 drop into both eyes 2 (two) times daily.    [provider]  ezetimibe  (ZETIA ) 10 MG tablet Take 10 mg by mouth daily.    [provider]  fexofenadine  (ALLEGRA) 180 MG tablet Take 180 mg by mouth daily.    [provider]  fluticasone  (FLONASE ) 50 MCG/ACT nasal spray Place into both nostrils.    [provider]  furosemide  (LASIX ) 40 MG tablet Take 1 tablet (40 mg total) by mouth daily. 09/18/22 10/18/22  Lesa Rape, MD  meclizine  (ANTIVERT ) 25 MG tablet Take 1 tablet (25 mg total) by mouth 3 (three) times daily as needed for dizziness. 12/14/23   Early Glisson, MD  metoprolol  succinate (TOPROL -XL) 50 MG 24 hr tablet Take 50 mg by mouth daily. 08/29/22   [provider]  montelukast  (SINGULAIR ) 10 MG tablet Take 10 mg by mouth daily.    [provider]  Multiple Vitamins-Minerals (PRESERVISION AREDS) TABS Take by mouth.    [provider]  nitroGLYCERIN  (NITROSTAT ) 0.4 MG SL tablet Place 0.4 mg under the tongue every 5 (five) minutes as needed. Chest pain.    [provider]  pantoprazole  (PROTONIX ) 40 MG tablet Take 40 mg by mouth every morning.    [provider]  PERIOGARD 0.12 % solution SMARTSIG:By Mouth 08/13/22   [provider]  potassium chloride  SA (KLOR-CON  M) 20 MEQ tablet Take 1 tablet (20 mEq total) by mouth daily for 7 days. 09/17/22 09/24/22  Lesa Rape, MD  spironolactone  (ALDACTONE ) 25 MG tablet Take 0.5 tablets (12.5 mg total) by mouth daily. 09/18/22  10/18/22  Lesa Rape, MD  warfarin (COUMADIN ) 5 MG tablet Take 5 mg by mouth daily.    [provider]      Allergies    Patient has no known allergies.    Review of Systems   Review of Systems  All other systems reviewed and are negative.   Physical Exam Updated Vital Signs BP (!) 171/83   Pulse 85   Temp 97.8 F (36.6 C)   Resp 18   Ht 1.803 m (5\' 11" )   Wt 72 kg   SpO2 99%   BMI 22.14 kg/m  Physical Exam Vitals and nursing note reviewed.  Constitutional:      General: He is not in acute distress.    Appearance: He is well-developed.  HENT:     Head: Normocephalic and atraumatic.      Mouth/Throat:     Pharynx: No oropharyngeal exudate.  Eyes:     General: No scleral icterus.       Right eye: No discharge.        Left eye: No discharge.     Conjunctiva/sclera: Conjunctivae normal.     Pupils: Pupils are equal, round, and reactive to light.  Neck:     Thyroid: No thyromegaly.     Vascular: No JVD.  Cardiovascular:     Rate and Rhythm: Normal rate and regular rhythm.     Heart sounds: Murmur heard.     No friction rub. No gallop.  Pulmonary:     Effort: Pulmonary effort is normal. No respiratory distress.     Breath sounds: Normal breath sounds. No wheezing or rales.  Abdominal:     General: Bowel sounds are normal. There is no distension.     Palpations: Abdomen is soft. There is no mass.     Tenderness: There is no abdominal tenderness.  Musculoskeletal:        General: No tenderness. Normal range of motion.     Cervical back: Normal range of motion and neck supple.     Right lower leg: No edema.     Left lower leg: No edema.  Lymphadenopathy:     Cervical: No cervical adenopathy.  Skin:    General: Skin is warm and dry.     Findings: No erythema or rash.  Neurological:     General: No focal deficit present.     Mental Status: He is alert.     Coordination: Coordination normal.     Comments: The patient has a stable gait walking all the way back from the waiting room to his room by himself unassisted without ataxia.  Speech is clear, cranial nerves III through XII are intact, strength and sensation are normal in all 4 extremities of the major muscle groups and coordination is totally normal.  His memory is intact  Psychiatric:        Behavior: Behavior normal.     ED Results / Procedures / Treatments   Labs (all labs ordered are listed, but only abnormal results are displayed) Labs Reviewed  BASIC METABOLIC PANEL WITH GFR - Abnormal; Notable for the following components:      Result Value   Glucose, Bld 118 (*)    Calcium  8.7 (*)    All other  components within normal limits  PROTIME-INR - Abnormal; Notable for the following components:   Prothrombin Time 28.5 (*)    INR 2.7 (*)    All other components within normal limits  I-STAT CHEM 8, ED -  Abnormal; Notable for the following components:   Glucose, Bld 115 (*)    Calcium , Ion 1.14 (*)    All other components within normal limits  DIGOXIN  LEVEL  CBC    EKG None  Radiology No results found.  Procedures Procedures    Medications Ordered in ED Medications - No data to display  ED Course/ Medical Decision Making/ A&P                                 Medical Decision Making Amount and/or Complexity of Data Reviewed Labs: ordered.   This patient presents with symptoms that started several days after starting scopolamine.  I suspect that his symptoms are secondary to the antihistamine that is persistently in his system.  He is slightly hypertensive but has no other acute findings, he is not vertiginous or symptomatic of the vertigo at this time, he does not appear to be orthostatic or tachycardic and does not appear ill.  Will check a few labs, dig level as he is still on digoxin , I have talked to the patient at length about how this may be a side effect of the medication and may need to get off the patch  Labs: I personally viewed and interpret the labs which show normal sodium potassium and kidney function.  Creatinine is baseline, hemoglobin is baseline, vital signs reflect mild hypertension.  Imaging: Not indicated  Medication management: Recommended stopping scopolamine immediately, going back to meclizine  which has been prescribed  The patient is agreeable to the plan and stable for discharge        Final Clinical Impression(s) / ED Diagnoses Final diagnoses:  Medication side effect    Rx / DC Orders ED Discharge Orders          Ordered    meclizine  (ANTIVERT ) 25 MG tablet  3 times daily PRN        12/14/23 1202              Early Glisson, MD 12/14/23 1203

## 2024-01-15 NOTE — H&P (Signed)
 Gastroenterology Preprocedural History and Physical     Chief Complaint/Reason for Procedure: [name] is a 83 y.o. male scheduled for a EGD, for the following indication Dysphagia using deep sedation with propofol  or general anesthesia as per anesthesia provider .  A History and Physical has been performed and patient medication allergies have been reviewed. The patient's tolerance of previous anesthesia has been reviewed. The risks and benefits of the procedure and the sedation options and risks were discussed with the patient. All questions were answered and informed consent obtained.  HPI  Medical History[1]  Surgical History[2]  Family History[3]  Social History   Socioeconomic History  . Marital status: Married    Spouse name: Not on file  . Number of children: Not on file  . Years of education: Not on file  . Highest education level: Not on file  Occupational History  . Not on file  Tobacco Use  . Smoking status: Former    Current packs/day: 0.00    Types: Cigarettes    Quit date: 07/22/1982    Years since quitting: 41.5  . Smokeless tobacco: Never  Vaping Use  . Vaping status: Never Used  Substance and Sexual Activity  . Alcohol use: Not Currently  . Drug use: Not Currently  . Sexual activity: Not on file  Other Topics Concern  . Not on file  Social History Narrative  . Not on file   Social Drivers of Health   Food Insecurity: Low Risk  (12/26/2023)   Food vital sign   . Within the past 12 months, you worried that your food would run out before you got money to buy more: Never true   . Within the past 12 months, the food you bought just didn't last and you didn't have money to get more: Never true  Transportation Needs: No Transportation Needs (12/26/2023)   Transportation   . In the past 12 months, has lack of reliable transportation kept you from medical appointments, meetings, work or from getting things needed for daily living? : No  Safety: Low  Risk  (12/26/2023)   Safety   . How often does anyone, including family and friends, physically hurt you?: Never   . How often does anyone, including family and friends, insult or talk down to you?: Never   . How often does anyone, including family and friends, threaten you with harm?: Never   . How often does anyone, including family and friends, scream or curse at you?: Never  Living Situation: Low Risk  (12/26/2023)   Living Situation   . What is your living situation today?: I have a steady place to live   . Think about the place you live. Do you have problems with any of the following? Choose all that apply:: None/None on this list    Current Rx ordered in [facility][4]  Allergies[5]    Physical Exam:  There were no vitals filed for this visit. There is no height or weight on file to calculate BMI.  Airway:  MALLAMPATI TWO   Heart:  normal S1 and S2 Lungs:  clear Abdomen:  soft, nontender, normal bowel sounds Mental Status:  awake and alert; oriented to person, place, and time     ASA Grade Assessment: ASA 2 - Patient with mild systemic disease with no functional limitations   I have reviewed patient's health history and patient is cleared to proceed with the proposed procedure at this facility.   Elspeth Ina, MD       [  1] Past Medical History: Diagnosis Date  . AI (aortic insufficiency)    Moderate oer 07/2012 TTE  . Atrial fibrillation    (CMD)   . Cataract   . Chronic back pain    s/p back surgery  . Glaucoma, unspecified glaucoma type, unspecified laterality    Med Hx: Glaucoma;   . Hypertension   . Myocardial infarction    (CMD) 2004   2004, s/p CAGBx3  . Pacemaker   . S/P CABG x 3 2004   2004  . S/P cardiac cath    07/2012  . SSS (sick sinus syndrome)    (CMD)    s/p dual chamber pacemaker.   [2] Past Surgical History: Procedure Laterality Date  . BACK SURGERY  2013   Procedure: BACK SURGERY  . CARDIAC CATHETERIZATION  07/2012   Procedure:  CARDIAC CATHETERIZATION  . CATARACT EXTRACTION     Procedure: CATARACT EXTRACTION; s/p PCIOL OD 1995, 1974 aphakic OS  . DIODE LASER CPC  09/01/2012   Procedure: DIODE LASER CPC;  Surgeon: Reyes Thresa Bracket, MD;  Location: [facility] OUTPATIENT OR;  Service: Ophthalmology;  Laterality: Left;  . OTHER SURGICAL HISTORY  2004   Procedure: OTHER SURGICAL HISTORY (heart bipass surgery); CABGx3  . OTHER SURGICAL HISTORY     Procedure: OTHER SURGICAL HISTORY (); hernia surgery  . PACEMAKER LEAD REPLACEMENT     Procedure: PACEMAKER LEAD PLACEMENT  . PARS PLANA VITRECTOMY     Procedure: PARS PLANA VITRECTOMY; OS  [3] Family History Problem Relation Name Age of Onset  . Arthritis Mother    . Cataracts Mother    . Retinal detachment Mother    . Glaucoma Mother    . Diabetes Father    . Hypertension Father    . Heart attack Father    . Multiple sclerosis Sister    . Glaucoma Sister    . Heart attack Brother    . Anesthesia problems Neg Hx    . Macular degeneration Neg Hx    [4] Meds Ordered in [facility]  Medication Sig Dispense Refill  . digoxin  (LANOXIN ) 125 mcg (0.125 mg) tablet Take 125 mcg by mouth daily.    . Entresto  49-51 mg per tablet     . metoprolol  succinate (TOPROL  XL) 25 mg 24 hr tablet Take 50 mg by mouth.    . montelukast  (SINGULAIR ) 10 mg tablet     . RABEprazole (ACIPHEX) 20 mg DR tablet Take 1 tablet (20 mg total) by mouth 2 (two) times a day. 180 tablet 3  . aspirin  81 mg EC tablet Take 81 mg by mouth daily.    . atorvastatin  (LIPITOR ) 80 mg tablet Take 80 mg by mouth.    . chlorhexidine (PERIDEX) 0.12 % solution     . cholecalciferol  (VITAMIN D3) 10 mcg (400 unit) tablet Take 1 tablet by mouth Once Daily.    . cilostazoL (PLETAL) 50 mg tablet Take 50 mg by mouth.    . dorzolamide -timoloL  (COSOPT ) 22.3-6.8 mg/mL ophthalmic solution INSTILL 1 DROP INTO BOTH EYES  TWICE DAILY 30 mL 3  . empagliflozin  (JARDIANCE ) 10 mg tab Take by mouth daily.    . ergocalciferol  (VITAMIN D2)  200 mcg/mL (8,000 unit/mL) drop drops Take 1 tablet by mouth Once Daily.    . ezetimibe  (ZETIA ) 10 mg tablet     . fexofenadine (Allegra Allergy) 60 mg tablet Allegra    . furosemide  (LASIX ) 40 mg tablet Take 40 mg by mouth daily.    . meclizine  (  ANTIVERT ) 25 mg tablet Take 25 mg by mouth 3 (three) times a day as needed for dizziness. 30 tablet 1  . nitroglycerin  (NITROSTAT ) 0.4 mg SL tablet Place 0.4 mg under the tongue daily as needed.    SABRA olmesartan (BENICAR) 20 mg tablet Take 30 mg by mouth Once Daily. (Patient not taking: Reported on 03/10/2023)    . potassium chloride  20 mEq ER tablet Take 20 mEq by mouth.    SABRA scopolamine (TRANSDERM-SCOP) 1 mg over 3 days pt3d patch APPLY 1 PATCH TOPICALLY TO THE SKIN EVERY 72 HOURS AS NEEDED    . spironolactone  (ALDACTONE ) 25 mg tablet Take 12.5 mg by mouth daily.    . vit C,E-Zn-coppr-lutein-zeaxan (PreserVision AREDS-2) 250-90-40-1 mg cap capsule Take 1 capsule by mouth 2 (two) times a day.    . warfarin (COUMADIN ) 5 mg tablet      No current Epic-ordered facility-administered medications on file.  [5] Allergies Allergen Reactions  . Ramipril Rash

## 2024-03-25 NOTE — Progress Notes (Signed)
 Ophthalmology Department Clinical Visit Note     CHIEF COMPLAINT Patient presents for Follow Up Exam (Pt states  My vision comes and goes but there's no real change in it since I was here last. I use my drop as directed without any issues.>)   HISTORY OF PRESENT ILLNESS: [name] is a 83 y.o. old male who presents to the clinic today for:   HPI     Follow Up Exam   Follow-Up For:: Primary open angle glaucoma of left eye, severe stage , Primary open angle glaucoma of right eye, mild stage    . Additional comments: Pt states  My vision comes and goes but there's no real change in it since I was here last. I use my drop as directed without any issues.>      Last edited by Valentin Shawnee Mutter, COT on 03/29/2024  8:06 AM.    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  03/10/2023: Good reliability OU OD: superior peripheral and nasal defects-VFI 81; MD -8.8; GPA -0.6 OS: superior paracentral and nasal peripheral defects-VFI 72; MD -10.6; GPA -0.1  [facility] visual field interpretation  12/14/2021: Good reliability OU OD: Superior peripheral defect - VFI 85; MD -7.5; fovea 27; GPA -0.6 OS: Superior paracentral defect and nasal suppression - VFI 79; MD -9.4; fovea 6; GPA +0.1  [facility]rey visual field interpretation  03/14/2021: Good reliability OU OD: Superior peripheral defect (maybe artifact) -VFI 84; MD -6.2; GPA 0.6  OS: Superior paracentral and nasal defect-VFI 80; MD -13.2; fovea 15; GPA +0.2; maybe worse   [facility]rey visual field interpretation  11/25/2018: Good reliability OU OD: Normal (superior peripheral artifact) - VFI 92; MD -6.4; fovea 27; stable OS: Superior paracentral and nasal defect - VFI 83; MD -6.9; fovea 10; stable   [facility]rey visual field interpretation  09/01/2015: Good  reliability OU OD:  Superior peripheral defect - VFI 91; MD-6.2; stable OS: Superior paracentral defect and nasal defect - VFI 81; MD-8.0; fovea 15; stable   [facility]rey visual field interpretation 11/26/2013: Good reliability OU OD: Superior peripheral arcuate defect, maybe partly artifact; stable OS: Superior paracentral defect; stable   [facility]rey visual field interpretation 04/24/2012:   Good reliability OU   OD: Probably normal - superior peripheral artifact   OS: Superior paracentral defect - stable   Non-specific peripheral suppression probably artifact   OCT RNFL -  03/10/2023 Reliability:  Good, QI  27 OD, QI  29 OS OD:  Normal double-hump pattern on profile view; sup avg 95, inf avg 123 OS:  Normal double-hump pattern on profile view; sup avg 87, inf avg 76  OCT RNFL -  OD only  12/14/2021 Reliability:  Good OD:  SS  6/10 OD, 0/10 OS OD:  Normal double-hump pattern on profile view; sup avg 99, inf avg 115 OS:  No data  OCT RNFL -  02/05/2021: Reliability:  Good, SS  9/10 OD,   8/10 OS OD:  Normal double-hump pattern on profile view; sup avg 99, inf avg 118 OS:  Thin superiorly on profile view; sup avg 70, inf avg 103    CURRENT MEDICATIONS: Current Outpatient Medications on File Prior to Visit (Ophthalmic Drugs)  Medication Sig  . dorzolamide -timoloL  (COSOPT ) 22.3-6.8 mg/mL ophthalmic solution INSTILL 1 DROP INTO  BOTH EYES  TWICE DAILY   Current Outpatient Medications on File Prior to Visit (Other)  Medication Sig  . vit C,E-Zn-coppr-lutein-zeaxan (PreserVision AREDS-2) 250-90-40-1 mg cap capsule Take 1 capsule by mouth 2 (two) times a day.  . aspirin  81 mg EC tablet Take 81 mg by mouth daily.  . atorvastatin  (LIPITOR ) 80 mg tablet Take 80 mg by mouth.  . chlorhexidine (PERIDEX) 0.12 % solution   . cholecalciferol  (VITAMIN D3) 10 mcg (400 unit) tablet Take 1 tablet by mouth Once Daily.  . cilostazoL (PLETAL) 50 mg tablet Take 50 mg by mouth.  . digoxin  (LANOXIN ) 125 mcg  (0.125 mg) tablet Take 125 mcg by mouth daily.  . empagliflozin  (JARDIANCE ) 10 mg tab Take by mouth daily.  . Entresto  49-51 mg per tablet   . ergocalciferol  (VITAMIN D2) 200 mcg/mL (8,000 unit/mL) drop drops Take 1 tablet by mouth Once Daily.  . ezetimibe  (ZETIA ) 10 mg tablet   . fexofenadine (Allegra Allergy) 60 mg tablet Allegra  . furosemide  (LASIX ) 40 mg tablet Take 40 mg by mouth daily.  . meclizine  (ANTIVERT ) 25 mg tablet Take 25 mg by mouth 3 (three) times a day as needed for dizziness.  . metoprolol  succinate (TOPROL  XL) 25 mg 24 hr tablet Take 50 mg by mouth.  . montelukast  (SINGULAIR ) 10 mg tablet   . nitroglycerin  (NITROSTAT ) 0.4 mg SL tablet Place 0.4 mg under the tongue daily as needed.  SABRA olmesartan (BENICAR) 20 mg tablet Take 30 mg by mouth Once Daily. (Patient not taking: Reported on 03/10/2023)  . potassium chloride  20 mEq ER tablet Take 20 mEq by mouth.  . RABEprazole (ACIPHEX) 20 mg DR tablet Take 1 tablet (20 mg total) by mouth 2 (two) times a day.  . scopolamine (TRANSDERM-SCOP) 1 mg over 3 days pt3d patch APPLY 1 PATCH TOPICALLY TO THE SKIN EVERY 72 HOURS AS NEEDED  . spironolactone  (ALDACTONE ) 25 mg tablet Take 12.5 mg by mouth daily.  SABRA warfarin (COUMADIN ) 5 mg tablet     Referring physician: No referring provider defined for this encounter.  ALLERGIES Allergies  Allergen Reactions  . Ramipril Rash    PAST MEDICAL HISTORY Past Medical History:  Diagnosis Date  . AI (aortic insufficiency)    Moderate oer 07/2012 TTE  . Atrial fibrillation    (CMD)   . Cataract   . Chronic back pain    s/p back surgery  . Glaucoma, unspecified glaucoma type, unspecified laterality    Med Hx: Glaucoma;   . Hypertension   . Myocardial infarction    (CMD) 2004   2004, s/p CAGBx3  . Pacemaker   . S/P CABG x 3 2004   2004  . S/P cardiac cath    07/2012  . SSS (sick sinus syndrome)    (CMD)    s/p dual chamber pacemaker.    Past Surgical History:  Procedure Laterality  Date  . BACK SURGERY  2013   Procedure: BACK SURGERY  . CARDIAC CATHETERIZATION  07/2012   Procedure: CARDIAC CATHETERIZATION  . CATARACT EXTRACTION     Procedure: CATARACT EXTRACTION; s/p PCIOL OD 1995, 1974 aphakic OS  . DIODE LASER CPC  09/01/2012   Procedure: DIODE LASER CPC;  Surgeon: Reyes Thresa Bracket, MD;  [facility] OUT[facility];  Service: Ophthalmology;  Laterality: Left;  . OTHER SURGICAL HISTORY  2004   Procedure: OTHER SURGICAL HISTORY (heart bipass surgery); CABGx3  . OTHER SURGICAL HISTORY     Procedure: OTHER SURGICAL HISTORY (); hernia  surgery  . PACEMAKER LEAD REPLACEMENT     Procedure: PACEMAKER LEAD PLACEMENT  . PARS PLANA VITRECTOMY     Procedure: PARS PLANA VITRECTOMY; OS    FAMILY HISTORY Family History  Problem Relation Name Age of Onset  . Arthritis Mother    . Cataracts Mother    . Retinal detachment Mother    . Glaucoma Mother    . Diabetes Father    . Hypertension Father    . Heart attack Father    . Multiple sclerosis Sister    . Glaucoma Sister    . Heart attack Brother    . Anesthesia problems Neg Hx    . Macular degeneration Neg Hx      SOCIAL HISTORY Social History   Tobacco Use  . Smoking status: Former    Current packs/day: 0.00    Types: Cigarettes    Quit date: 07/22/1982    Years since quitting: 41.7  . Smokeless tobacco: Never  Vaping Use  . Vaping status: Never Used  Substance Use Topics  . Alcohol use: Not Currently  . Drug use: Not Currently        OPHTHALMIC EXAM:  Base Eye Exam     Visual Acuity (Snellen - Linear)       Right Left   Dist cc 20/30 -1 CF at 3'   Dist ph cc NI NI         Tonometry (8:30 AM)       Right Left   Pressure 14          Neuro/Psych     Oriented x3: Yes   Mood/Affect: Normal           Slit Lamp and Fundus Exam     Slit Lamp Exam       Right Left   Lids/Lashes Dermatochalasis Ptosis   Conjunctiva/Sclera White and quiet Scar   Cornea Endopigment Endopigment,  CLin good position   Anterior Chamber Deep and quiet Deep and quiet   [facility] doctor PC IOL Aphakia         Fundus Exam       Right Left   Disc 2 pallor thin vessels no hem    C/D Ratio 0.6    Macula mottled             IMAGING AND PROCEDURES:  @EYRES @     ASSESSMENT: 1. Primary open angle glaucoma of left eye, severe stage      2. Primary open angle glaucoma (POAG) of right eye, moderate stage       Patient is tolerating and taking drops as prescribed. IOP good OD Continue drops the same Recheck in 5 months  PLAN: Dorzolamide -Timolol  x2 OU  Reviewed proper drop instillation technique Return for 5 months.  Ophthalmic Meds Ordered this visit:  There were no meds ordered this visit.      Patient Instructions  DAILY DRUG REMINDER  Wait 10 minutes between drops!!! (Keep eyes closed for 2 MINUTES after each drop)   Drug EYE R = Right L = Left B = Both Breakfast Lunch Supper Bedtime Time Frame  Dorz-tim (Dark blue top)  B X  X            HOW TO TAKE EYEDROPS 1.   Make sure you understand when, and exactly how often, to take your eyedrop medicine.  Preferably, learn the names of your medicines, or at least be able to identify your eyedrops by the  color of the cap of the bottle (e.g., "I take the purple top 2 times a day in the right eye and the yellow top 1 time a day in both eyes."). 2.  Always wash your hands prior to instilling your eyedrops. 3.  If you are putting drops in both eyes, then sit down or lie down to put in your drops.  This is because it is important to close the eye getting drops for 2 minutes immediately after instilling the drop (this markedly improves absorption).  If you are putting the drops in both eyes, then you will be closing both eyes for at least 2 minutes, therefore you should be sitting or lying down.  If you must stand to put in the drops (need to look in mirror), then have a chair next to you so that you can sit  just after instilling the drops and close your eyes for 2 minutes.  If you are only instilling an eyedrop in one eye, then you only have to close that eye after the drop, and therefore you don't necessarily have to sit or lie. 4.  If you are taking more than one drop at a time in the same eye, then you must separate the drops by at least 10 minutes. 5.  To put in the drop, hold the bottle in your dominant hand between your forefinger and thumb and invert the bottle over the targeted eye.  Your head should be tilted back and you should roll your eyes upward. 6.  With your other hand, pull the lower eyelid down and try to instill the drop in the "pouch" made by pulling the lid down, or on the lower part of the eye ball.  Position the bottle close to the eye, but do not touch the eye with the dropper tip.  Squeeze the sides of the bottle and a drop of the medicine will come out. 7.  You will feel the drop when it contacts the eye, and then you should immediately close the eye, or if you are putting the drop in both eyes, move your hands immediately to the other eye, and place the drop in it, and then close both eyes as recommended in #2 above. 8.  While the eye/s are closed, use a tissue to wipe away any medication that remains on the lid or lashes.  You may do this during the 2 minutes that the eye remains closed, just do not open the eye to wipe it. 9.  Always remember to put the cap back on the bottle and store the bottle away from pets and not in direct sunlight.  The bottle does not have to be refrigerated, but it may be if that is preferred (some people prefer feeling the cooled eyedrop on the eye). 10.  Never stop the drops without conferring with your doctor first, even if you feel that the drops are not helping your eye.  If you believe you are experiencing side-effects from the eyedrops, then discuss that, or any issues or concerns,  with your doctor.     This document serves as a record of services  personally performed by J. Thresa Bracket. It was created on their behalf by My Rayleen, a trained medical scribe. The creation of this record is the provider's dictation and/or activities during the visit. I agree the documentation is accurate and complete.  Electronically signed by: JINNY Thresa Bracket, MD 03/29/2024 8:32 AM

## 2024-04-06 ENCOUNTER — Observation Stay (HOSPITAL_COMMUNITY)
Admission: EM | Admit: 2024-04-06 | Discharge: 2024-04-07 | Disposition: A | Discharge status: Home / Self Care | Attending: Emergency Medicine | Admitting: Emergency Medicine

## 2024-04-06 ENCOUNTER — Encounter (HOSPITAL_COMMUNITY): Payer: Self-pay

## 2024-04-06 ENCOUNTER — Other Ambulatory Visit: Payer: Self-pay

## 2024-04-06 ENCOUNTER — Emergency Department (HOSPITAL_COMMUNITY)

## 2024-04-06 DIAGNOSIS — I2510 Atherosclerotic heart disease of native coronary artery without angina pectoris: Secondary | ICD-10-CM | POA: Diagnosis not present

## 2024-04-06 DIAGNOSIS — I739 Peripheral vascular disease, unspecified: Secondary | ICD-10-CM | POA: Insufficient documentation

## 2024-04-06 DIAGNOSIS — J449 Chronic obstructive pulmonary disease, unspecified: Secondary | ICD-10-CM | POA: Diagnosis not present

## 2024-04-06 DIAGNOSIS — R0602 Shortness of breath: Principal | ICD-10-CM

## 2024-04-06 DIAGNOSIS — I480 Paroxysmal atrial fibrillation: Secondary | ICD-10-CM | POA: Insufficient documentation

## 2024-04-06 DIAGNOSIS — I340 Nonrheumatic mitral (valve) insufficiency: Secondary | ICD-10-CM | POA: Diagnosis not present

## 2024-04-06 DIAGNOSIS — Z7982 Long term (current) use of aspirin: Secondary | ICD-10-CM | POA: Insufficient documentation

## 2024-04-06 DIAGNOSIS — I110 Hypertensive heart disease with heart failure: Secondary | ICD-10-CM | POA: Diagnosis not present

## 2024-04-06 DIAGNOSIS — I5022 Chronic systolic (congestive) heart failure: Secondary | ICD-10-CM | POA: Diagnosis not present

## 2024-04-06 DIAGNOSIS — R079 Chest pain, unspecified: Secondary | ICD-10-CM | POA: Diagnosis present

## 2024-04-06 DIAGNOSIS — Z7901 Long term (current) use of anticoagulants: Secondary | ICD-10-CM | POA: Diagnosis not present

## 2024-04-06 DIAGNOSIS — I350 Nonrheumatic aortic (valve) stenosis: Secondary | ICD-10-CM | POA: Diagnosis not present

## 2024-04-06 DIAGNOSIS — E782 Mixed hyperlipidemia: Secondary | ICD-10-CM | POA: Diagnosis not present

## 2024-04-06 DIAGNOSIS — I495 Sick sinus syndrome: Secondary | ICD-10-CM | POA: Diagnosis not present

## 2024-04-06 LAB — URINALYSIS, ROUTINE W REFLEX MICROSCOPIC
Bacteria, UA: NONE SEEN
Bilirubin Urine: NEGATIVE
Glucose, UA: >=500 mg/dL — AB
Hgb urine dipstick: NEGATIVE
Ketones, ur: NEGATIVE mg/dL
Leukocytes,Ua: NEGATIVE
Nitrite: NEGATIVE
Protein, ur: NEGATIVE mg/dL
Specific Gravity, Urine: 1.021 (ref 1.005–1.030)
pH: 6 (ref 5.0–8.0)

## 2024-04-06 LAB — COMPREHENSIVE METABOLIC PANEL WITH GFR
ALT: 36 U/L (ref 0–44)
AST: 31 U/L (ref 15–41)
Albumin: 3.6 g/dL (ref 3.5–5.0)
Alkaline Phosphatase: 47 U/L (ref 38–126)
Anion gap: 10 (ref 5–15)
BUN: 17 mg/dL (ref 8–23)
CO2: 23 mmol/L (ref 22–32)
Calcium: 8.5 mg/dL — ABNORMAL LOW (ref 8.9–10.3)
Chloride: 104 mmol/L (ref 98–111)
Creatinine, Ser: 0.81 mg/dL (ref 0.61–1.24)
GFR, Estimated: >60 mL/min (ref >60)
Glucose, Bld: 113 mg/dL — ABNORMAL HIGH (ref 70–99)
Potassium: 3.8 mmol/L (ref 3.5–5.1)
Sodium: 137 mmol/L (ref 135–145)
Total Bilirubin: 0.8 mg/dL (ref 0.0–1.2)
Total Protein: 6.5 g/dL (ref 6.5–8.1)

## 2024-04-06 LAB — CBC WITH DIFFERENTIAL/PLATELET
Abs Immature Granulocytes: 0.02 K/uL (ref 0.00–0.07)
Basophils Absolute: 0 K/uL (ref 0.0–0.1)
Basophils Relative: 0 %
Eosinophils Absolute: 0.1 K/uL (ref 0.0–0.5)
Eosinophils Relative: 1 %
HCT: 43.9 % (ref 39.0–52.0)
Hemoglobin: 14.7 g/dL (ref 13.0–17.0)
Immature Granulocytes: 0 %
Lymphocytes Relative: 12 %
Lymphs Abs: 1.1 K/uL (ref 0.7–4.0)
MCH: 32 pg (ref 26.0–34.0)
MCHC: 33.5 g/dL (ref 30.0–36.0)
MCV: 95.4 fL (ref 80.0–100.0)
Monocytes Absolute: 1 K/uL (ref 0.1–1.0)
Monocytes Relative: 11 %
Neutro Abs: 6.8 K/uL (ref 1.7–7.7)
Neutrophils Relative %: 76 %
Platelets: 195 K/uL (ref 150–400)
RBC: 4.6 MIL/uL (ref 4.22–5.81)
RDW: 13.3 % (ref 11.5–15.5)
WBC: 9 K/uL (ref 4.0–10.5)
nRBC: 0 % (ref 0.0–0.2)

## 2024-04-06 LAB — D-DIMER, QUANTITATIVE: D-Dimer, Quant: 0.86 ug{FEU}/mL — ABNORMAL HIGH (ref 0.00–0.50)

## 2024-04-06 LAB — TROPONIN I (HIGH SENSITIVITY): Troponin I (High Sensitivity): 10 ng/L (ref <18)

## 2024-04-06 LAB — PROTIME-INR
INR: 2.5 — ABNORMAL HIGH (ref 0.8–1.2)
Prothrombin Time: 27.8 s — ABNORMAL HIGH (ref 11.4–15.2)

## 2024-04-06 LAB — BRAIN NATRIURETIC PEPTIDE: B Natriuretic Peptide: 105 pg/mL — ABNORMAL HIGH (ref 0.0–100.0)

## 2024-04-06 NOTE — ED Triage Notes (Signed)
 Patient arrives via [facility] , c/o shortness of breath this morning. Patient reports  he hasn't been feeling like  himself for the last couple of days. Chest pain presented this morning pt took NTG this morning chest pain improved. Denies chest pain at present. Patient has a history of MI 2004. Atrial pacemaker placed last year. EMS inserted IV Left wrist and CBG 117

## 2024-04-06 NOTE — ED Notes (Signed)
 Pt family questioning when they will know if pt is staying or leaving- informed that we are waiting on lab results and EDP disposition.

## 2024-04-06 NOTE — ED Provider Notes (Signed)
 [place] EMERGENCY DEPARTMENT AT [facility] Provider Note   CSN: [id number] Arrival date & time: 04/06/24  2045     Patient presents with: Shortness of Breath   RISHAV ROCKEFELLER is a 83 y.o. male.   Patient is an 83 year old male who presents to the emergency department with a chief complaint of chest pain and shortness of breath.  Patient notes that this morning he had a sensation of shortness of breath which did resolve on its own.  Patient notes that this evening he had another onset of chest pain and shortness of breath which did resolve with nitroglycerin .  He denies any active complaints at this point and notes that he is feeling better.  He does have a history of CABG in 2004 and does have an atrial pacemaker.  Patient also notes that he has chronic edema in his lower extremities which has not become worse.  He notes he has had no cough, congestion, rhinorrhea, sore throat.  He denies any dizziness, lightheadedness or syncope.  He notes that he has had some fatigue over the past few days.   Shortness of Breath      Prior to Admission medications   Medication Sig Start Date End Date Taking? Authorizing Provider  Ascorbic Acid  100 MG CHEW Chew 1,000 mg by mouth daily.    [provider]  aspirin  EC 81 MG tablet Take 81 mg by mouth daily.    [provider]  atorvastatin  (LIPITOR ) 80 MG tablet Take 80 mg by mouth daily.    [provider]  digoxin  (LANOXIN ) 0.125 MG tablet Take 125 mcg by mouth daily.    [provider]  dorzolamide -timolol  (COSOPT ) 22.3-6.8 MG/ML ophthalmic solution Place 1 drop into both eyes 2 (two) times daily.    [provider]  ezetimibe  (ZETIA ) 10 MG tablet Take 10 mg by mouth daily.    [provider]  fexofenadine (ALLEGRA) 180 MG tablet Take 180 mg by mouth daily.    [provider]  fluticasone  (FLONASE ) 50 MCG/ACT nasal spray Place into both nostrils.    [provider]   furosemide  (LASIX ) 40 MG tablet Take 1 tablet (40 mg total) by mouth daily. 09/18/22 10/18/22  Christobal Guadalajara, MD  meclizine  (ANTIVERT ) 25 MG tablet Take 1 tablet (25 mg total) by mouth 3 (three) times daily as needed for dizziness. 12/14/23   Cleotilde Rogue, MD  metoprolol  succinate (TOPROL -XL) 50 MG 24 hr tablet Take 50 mg by mouth daily. 08/29/22   [provider]  montelukast  (SINGULAIR ) 10 MG tablet Take 10 mg by mouth daily.    [provider]  Multiple Vitamins-Minerals (PRESERVISION AREDS) TABS Take by mouth.    [provider]  nitroGLYCERIN  (NITROSTAT ) 0.4 MG SL tablet Place 0.4 mg under the tongue every 5 (five) minutes as needed. Chest pain.    [provider]  pantoprazole  (PROTONIX ) 40 MG tablet Take 40 mg by mouth every morning.    [provider]  PERIOGARD 0.12 % solution SMARTSIG:By Mouth 08/13/22   [provider]  potassium chloride  SA (KLOR-CON  M) 20 MEQ tablet Take 1 tablet (20 mEq total) by mouth daily for 7 days. 09/17/22 09/24/22  Christobal Guadalajara, MD  spironolactone  (ALDACTONE ) 25 MG tablet Take 0.5 tablets (12.5 mg total) by mouth daily. 09/18/22 10/18/22  Christobal Guadalajara, MD  warfarin (COUMADIN ) 5 MG tablet Take 5 mg by mouth daily.    [provider]    Allergies: Patient has no  known allergies.    Review of Systems  Respiratory:  Positive for shortness of breath.   All other systems reviewed and are negative.   Updated Vital Signs BP 130/72 (BP Location: Right Arm)   Pulse 60   Temp 97.8 F (36.6 C) (Oral)   Resp 16   Ht 5' 11 (1.803 m)   Wt 68 kg   SpO2 94%   BMI 20.92 kg/m   Physical Exam Vitals and nursing note reviewed.  Constitutional:      General: He is not in acute distress.    Appearance: Normal appearance. He is not ill-appearing.  HENT:     Head: Normocephalic and atraumatic.     Nose: Nose normal.     Mouth/Throat:     Mouth: Mucous membranes are moist.  Eyes:     Extraocular Movements:  Extraocular movements intact.     Conjunctiva/sclera: Conjunctivae normal.     Pupils: Pupils are equal, round, and reactive to light.  Cardiovascular:     Rate and Rhythm: Normal rate and regular rhythm.     Pulses: Normal pulses.     Heart sounds: Normal heart sounds. No murmur heard.    No gallop.  Pulmonary:     Effort: Pulmonary effort is normal. No tachypnea.     Breath sounds: Normal breath sounds. No decreased breath sounds, wheezing, rhonchi or rales.  Chest:     Chest wall: No tenderness.  Abdominal:     General: Abdomen is flat. Bowel sounds are normal.     Palpations: Abdomen is soft.     Tenderness: There is no abdominal tenderness.  Musculoskeletal:        General: Normal range of motion.     Cervical back: Normal range of motion and neck supple.     Right lower leg: Edema present.     Left lower leg: Edema present.  Skin:    General: Skin is warm and dry.     Findings: No rash.  Neurological:     General: No focal deficit present.     Mental Status: He is alert and oriented to person, place, and time. Mental status is at baseline.     Cranial Nerves: No cranial nerve deficit.     Motor: No weakness.  Psychiatric:        Mood and Affect: Mood normal.        Behavior: Behavior normal.        Thought Content: Thought content normal.        Judgment: Judgment normal.     (all labs ordered are listed, but only abnormal results are displayed) Labs Reviewed  URINALYSIS, ROUTINE W REFLEX MICROSCOPIC - Abnormal; Notable for the following components:      Result Value   Glucose, UA >=500 (*)    All other components within normal limits  CBC WITH DIFFERENTIAL/PLATELET  BRAIN NATRIURETIC PEPTIDE  COMPREHENSIVE METABOLIC PANEL WITH GFR  D-DIMER, QUANTITATIVE  PROTIME-INR  TROPONIN I (HIGH SENSITIVITY)    EKG: EKG Interpretation Date/Time:  Tuesday April 06 2024 22:05:14 EDT Ventricular Rate:  60 PR Interval:  227 QRS Duration:  110 QT  Interval:  391 QTC Calculation: 391 R Axis:   68  Text Interpretation: duplicate Confirmed by Patsey Lot [phone]) on 04/06/2024 10:24:41 PM  Radiology: No results found.   Procedures   Medications Ordered in the ED - No data to display  Clinical Course as of 04/06/24 2302  Tue Apr 06, 2024  2301 Stable  31 YOM with a chief complaint of CP HX CABG Recurred and improved with NTG Pain resolved On ASA and WAR [CC]    Clinical Course User Index [CC] Jerral Meth, MD                                 Medical Decision Making Patient does remain stable at this time.  He is currently chest pain free in the emergency department and notes that he has no active dyspnea at this point.  EKG does demonstrate an atrial paced rhythm with no indication for STEMI.  Chest x-ray independently reviewed by myself demonstrated no obvious acute cardiopulmonary process.  Vital signs are stable at this point as well.  Blood work is still pending.  Will sign patient out to Dr. Jerral, MD pending remaining blood work and final dispo.  Amount and/or Complexity of Data Reviewed Labs: ordered. Radiology: ordered.        Final diagnoses:  None    ED Discharge Orders     None          Daralene Lonni JONETTA DEVONNA 04/06/24 2303    Patsey Lot, MD 04/06/24 [phone]

## 2024-04-06 NOTE — ED Provider Notes (Signed)
 Care of patient received from prior provider at 11:02 PM, please see their note for complete H/P and care plan.  Received handoff per ED course.  Clinical Course as of 04/07/24 0317  Tue Apr 06, 2024  2301 Stable 31 YOM with a chief complaint of CP HX CABG Recurred and improved with NTG Pain resolved On ASA and WAR [CC]  Wed Apr 07, 2024  0138 Troponin I (High Sensitivity): 10 Troponins negative and flat. [CC]  9861 D-Dimer, Quant(!): 0.86 Patient [name] low.  Patient years criteria negative.  D-dimer negative for pulmonary embolism by these standards. [CC]    Clinical Course User Index [CC] Jerral Meth, MD    Reassessment: I reevaluated at bedside.  His chest pain remains resolved, however the initial chief complaint is concerning.  His initial event was at rest and required nitroglycerin  and his secondary event was on exertion.  I consulted cardiology.  They recommended the patient be treated for his unstable angina event and to be admitted over to [facility] under the hospitalist service given his medical comorbidities.  Discussed with patient, he is in agreement with this plan.  Disposition:   Based on the above findings, I believe this patient is stable for admission.    Patient/family educated about specific findings on our evaluation and explained exact reasons for admission.  Patient/family educated about clinical situation and time was allowed to answer questions.   Admission team communicated with and agreed with need for admission. Patient admitted. Patient ready to move at this time.     Emergency Department Medication Summary:   Medications  aspirin  chewable tablet 324 mg (has no administration in time range)            Jerral Meth, MD 04/07/24 [phone]

## 2024-04-07 ENCOUNTER — Observation Stay (HOSPITAL_BASED_OUTPATIENT_CLINIC_OR_DEPARTMENT_OTHER)

## 2024-04-07 ENCOUNTER — Encounter (HOSPITAL_COMMUNITY): Payer: Self-pay | Admitting: Family Medicine

## 2024-04-07 ENCOUNTER — Other Ambulatory Visit (HOSPITAL_COMMUNITY): Payer: Self-pay | Admitting: *Deleted

## 2024-04-07 DIAGNOSIS — I480 Paroxysmal atrial fibrillation: Secondary | ICD-10-CM | POA: Insufficient documentation

## 2024-04-07 DIAGNOSIS — I2510 Atherosclerotic heart disease of native coronary artery without angina pectoris: Secondary | ICD-10-CM

## 2024-04-07 DIAGNOSIS — R079 Chest pain, unspecified: Secondary | ICD-10-CM | POA: Diagnosis not present

## 2024-04-07 DIAGNOSIS — R0789 Other chest pain: Secondary | ICD-10-CM

## 2024-04-07 DIAGNOSIS — Z7901 Long term (current) use of anticoagulants: Secondary | ICD-10-CM

## 2024-04-07 DIAGNOSIS — I2583 Coronary atherosclerosis due to lipid rich plaque: Secondary | ICD-10-CM

## 2024-04-07 DIAGNOSIS — I340 Nonrheumatic mitral (valve) insufficiency: Secondary | ICD-10-CM

## 2024-04-07 DIAGNOSIS — I350 Nonrheumatic aortic (valve) stenosis: Secondary | ICD-10-CM

## 2024-04-07 DIAGNOSIS — E785 Hyperlipidemia, unspecified: Secondary | ICD-10-CM

## 2024-04-07 DIAGNOSIS — I5022 Chronic systolic (congestive) heart failure: Secondary | ICD-10-CM | POA: Diagnosis not present

## 2024-04-07 DIAGNOSIS — I495 Sick sinus syndrome: Secondary | ICD-10-CM

## 2024-04-07 LAB — PROTIME-INR
INR: 2.7 — ABNORMAL HIGH (ref 0.8–1.2)
Prothrombin Time: 29.6 s — ABNORMAL HIGH (ref 11.4–15.2)

## 2024-04-07 LAB — ECHOCARDIOGRAM COMPLETE
AR max vel: 1.53 cm2
AV Area VTI: 1.32 cm2
AV Area mean vel: 1.47 cm2
AV Mean grad: 14 mmHg
AV Peak grad: 25.9 mmHg
Ao pk vel: 2.54 m/s
Area-P 1/2: 4.68 cm2
Height: 71 in
P 1/2 time: 514 ms
S' Lateral: 3.4 cm
Weight: 2400 [oz_av]

## 2024-04-07 LAB — TROPONIN I (HIGH SENSITIVITY): Troponin I (High Sensitivity): 10 ng/L (ref <18)

## 2024-04-07 MED ORDER — SACUBITRIL-VALSARTAN 49-51 MG PO TABS
1.0000 | ORAL_TABLET | Freq: Two times a day (BID) | ORAL | Status: DC
  Administered 2024-04-07: 1 via ORAL
  Filled 2024-04-07 (×3): qty 1

## 2024-04-07 MED ORDER — DIGOXIN 125 MCG PO TABS
125.0000 ug | ORAL_TABLET | Freq: Every day | ORAL | Status: DC
  Administered 2024-04-07: 125 ug via ORAL
  Filled 2024-04-07: qty 1

## 2024-04-07 MED ORDER — ASPIRIN 81 MG PO CHEW
324.0000 mg | CHEWABLE_TABLET | Freq: Once | ORAL | Status: AC
  Administered 2024-04-07: 324 mg via ORAL
  Filled 2024-04-07: qty 4

## 2024-04-07 MED ORDER — EZETIMIBE 10 MG PO TABS
10.0000 mg | ORAL_TABLET | Freq: Every day | ORAL | Status: DC
  Administered 2024-04-07: 10 mg via ORAL
  Filled 2024-04-07: qty 1

## 2024-04-07 MED ORDER — ONDANSETRON HCL 4 MG/2ML IJ SOLN: 4.0000 mg | Freq: Four times a day (QID) | INTRAMUSCULAR | Status: DC | PRN

## 2024-04-07 MED ORDER — PANTOPRAZOLE SODIUM 40 MG PO TBEC
40.0000 mg | DELAYED_RELEASE_TABLET | Freq: Every morning | ORAL | Status: DC
  Administered 2024-04-07: 40 mg via ORAL
  Filled 2024-04-07: qty 1

## 2024-04-07 MED ORDER — ASPIRIN 81 MG PO TBEC
81.0000 mg | DELAYED_RELEASE_TABLET | Freq: Every day | ORAL | Status: DC
  Administered 2024-04-07: 81 mg via ORAL
  Filled 2024-04-07: qty 1

## 2024-04-07 MED ORDER — ATORVASTATIN CALCIUM 40 MG PO TABS: 80.0000 mg | ORAL_TABLET | Freq: Every day | ORAL | Status: DC

## 2024-04-07 MED ORDER — ACETAMINOPHEN 650 MG RE SUPP: 650.0000 mg | Freq: Four times a day (QID) | RECTAL | Status: DC | PRN

## 2024-04-07 MED ORDER — METOPROLOL SUCCINATE ER 50 MG PO TB24
50.0000 mg | ORAL_TABLET | Freq: Every day | ORAL | Status: DC
  Administered 2024-04-07: 50 mg via ORAL
  Filled 2024-04-07: qty 1

## 2024-04-07 MED ORDER — VITAMIN C 500 MG PO TABS
1000.0000 mg | ORAL_TABLET | Freq: Every day | ORAL | Status: DC
  Administered 2024-04-07: 1000 mg via ORAL
  Filled 2024-04-07: qty 2

## 2024-04-07 MED ORDER — ACETAMINOPHEN 325 MG PO TABS: 650.0000 mg | ORAL_TABLET | Freq: Four times a day (QID) | ORAL | Status: DC | PRN

## 2024-04-07 MED ORDER — MONTELUKAST SODIUM 10 MG PO TABS: 10.0000 mg | ORAL_TABLET | Freq: Every day | ORAL | Status: DC

## 2024-04-07 MED ORDER — DORZOLAMIDE HCL-TIMOLOL MAL 2-0.5 % OP SOLN
1.0000 [drp] | Freq: Two times a day (BID) | OPHTHALMIC | Status: DC
Start: 2024-04-07 — End: 2024-04-07
  Administered 2024-04-07: 1 [drp] via OPHTHALMIC
  Filled 2024-04-07: qty 10

## 2024-04-07 MED ORDER — EMPAGLIFLOZIN 10 MG PO TABS
10.0000 mg | ORAL_TABLET | Freq: Every day | ORAL | Status: DC
  Administered 2024-04-07: 10 mg via ORAL
  Filled 2024-04-07: qty 1

## 2024-04-07 MED ORDER — ONDANSETRON HCL 4 MG PO TABS: 4.0000 mg | ORAL_TABLET | Freq: Four times a day (QID) | ORAL | Status: DC | PRN

## 2024-04-07 MED ORDER — LORATADINE 10 MG PO TABS
10.0000 mg | ORAL_TABLET | Freq: Every day | ORAL | Status: DC
Start: 2024-04-07 — End: 2024-04-07
  Administered 2024-04-07: 10 mg via ORAL
  Filled 2024-04-07: qty 1

## 2024-04-07 NOTE — Progress Notes (Signed)
 PHARMACY - ANTICOAGULATION CONSULT NOTE  Pharmacy Consult for heparin Indication: chest pain/ACS  No Known Allergies  Patient Measurements: Height: 5' 11 (180.3 cm) Weight: 68 kg (150 lb) IBW/kg (Calculated) : 75.3 HEPARIN DW (KG): 68  Vital Signs: Temp: 98 F (36.7 C) (09/17 0240) Temp Source: Oral (09/16 2117) BP: 153/77 (09/17 0240) Pulse Rate: 90 (09/17 0240)  Labs: Recent Labs    04/06/24 2218 04/07/24 0056  HGB 14.7  --   HCT 43.9  --   PLT 195  --   LABPROT 27.8*  --   INR 2.5*  --   CREATININE 0.81  --   TROPONINIHS 10 10    Estimated Creatinine Clearance: 65.3 mL/min (by C-G formula based on SCr of 0.81 mg/dL).   Medical History: Past Medical History:  Diagnosis Date   Dysrhythmia     dr ailene  in [place]   Glaucoma    Hypertension    Myocardial infarction [facility])    2005    Assessment: 83yo male c/o SOB and CP; initial CP event occurred at rest, second with exertion, improved with NTG, troponin negative but presentation concerning, plan to start heparin and tx to [facility] for further cards w/u.  Pt is on warfarin PTA for Afib, INR therapeutic.  Goal of Therapy:  Heparin level 0.3-0.7 units/ml Monitor platelets by anticoagulation protocol: Yes   Plan:  Hold warfarin and start heparin when INR <2.  Marvetta Dauphin, PharmD, BCPS  04/07/2024,3:42 AM

## 2024-04-07 NOTE — Hospital Course (Addendum)
 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  The patient states that he has been feeling somewhat fatigued over the past 2 to 3 days.  On the morning of 04/06/2024 he developed some left-sided chest pain and shortness of breath that lasted about 5 minutes.  This was at rest.  He went about his day and got his usual influenza vaccination.  After he went home he took a shower.  Around 7 PM, the patient developed chest pain and shortness of breath once again lasting about 4 to 5 minutes.  He described as a sharp sensation that is left-sided.  He denied any dizziness or syncope.  He denies any fevers, chills, hemoptysis, nausea, vomiting, diarrhea.  He denies any hematochezia or melena. In the ED, the patient was afebrile and hemodynamically stable with oxygen saturation 96% on room air.  WBC 9.0, hemoglobin 14.7, platelet 195.  Sodium 137, potassium 3.8, bicarbonate 23, serum creatinine 0.81.  LFTs were unremarkable.  EKG sinus rhythm nonspecific ST changes.  Chest x-ray was negative for any acute findings.  Troponin 10>>10.  D-dimer 0.6.  BNP 105.  INR 2.7.  UA was negative for pyuria.  Cardiology was consulted.  They felt his pain was more atypical.  Echo was done and showed EF 45-50%, mild decreased RVF, mild to mod TR, mild to mod AS, +WMA apical segment.  Patient ambulated the halls during the admission with no recurrent chest pain.  Cardiology cleared patient for d/c and to follow up with his cardiologist, Dr. GEORGANN Capers.

## 2024-04-07 NOTE — ED Notes (Signed)
 Pt given water per request

## 2024-04-07 NOTE — Progress Notes (Signed)
*  PRELIMINARY RESULTS* Echocardiogram 2D Echocardiogram has been performed.  [name] 04/07/2024, 11:37 AM

## 2024-04-07 NOTE — Plan of Care (Signed)

## 2024-04-07 NOTE — Discharge Summary (Signed)
 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  The patient states that he has been feeling somewhat fatigued over the past 2 to 3 days.  On the morning of 04/06/2024 he developed some left-sided chest pain and shortness of breath that lasted about 5 minutes.  This was at rest.  He went about his day and got his usual influenza vaccination.  After he went home he took a shower.  Around 7 PM, the patient developed chest pain and shortness of breath once again lasting about 4 to 5 minutes.  He described as a sharp sensation that is left-sided.  He denied any dizziness or syncope.  He denies any fevers, chills, hemoptysis, nausea, vomiting, diarrhea.  He denies any hematochezia or melena. In the ED, the patient was afebrile and hemodynamically stable with oxygen saturation 96% on room air.  WBC 9.0, hemoglobin 14.7, platelet 195.  Sodium 137, potassium 3.8, bicarbonate 23, serum creatinine 0.81.  LFTs were unremarkable.  EKG sinus rhythm nonspecific ST changes.  Chest x-ray was negative for any acute findings.  Troponin 10>>10.  D-dimer 0.6.  BNP 105.  INR 2.7.  UA was negative for pyuria.  Cardiology was consulted.  They felt his pain was more atypical.  Echo was done and showed EF 45-50%, mild decreased RVF, mild to mod TR, mild to mod AS, +WMA apical segment.  Patient ambulated the halls during the admission with no recurrent chest  pain.  Cardiology cleared patient for d/c and to follow up with his cardiologist, Dr. GEORGANN Ailene.  Assessment and Plan: Chest pain - ACS ruled out - Cardiology consult felt pain more atypical - Troponin 10>> 10 - Chest x-ray negative for infiltrates or edema - Continue aspirin  - Echo--EF 45-50%, mild decreased RVF, mild to mod TR, mild to mod AS, +WMA apical segment.    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   Coronary artery disease - Continue aspirin  - Continue statin - Continue metoprolol  succinate  Mixed hyperlipidemia - Continue statin   COPD - Stable on room air   Paroxysmal atrial fibrillation - Currently in paced rhythm - Continue warfarin - INR 2.7 on the day of admission   Essential hypertension - Continue metoprolol  succinate       Consultants: cardiology Procedures performed: none  Disposition: Home Diet recommendation:  Cardiac diet DISCHARGE MEDICATION: Allergies as of 04/07/2024   No Known Allergies      Medication List     TAKE these medications    Ascorbic Acid  100 MG Chew Chew 1,000 mg by mouth daily.   aspirin  EC 81 MG tablet Take 81 mg by mouth daily.   atorvastatin  80 MG tablet Commonly known as: LIPITOR  Take 80 mg by mouth daily.   digoxin  0.125 MG tablet Commonly known as: LANOXIN   Take 125 mcg by mouth daily.   dorzolamide -timolol  2-0.5 % ophthalmic solution Commonly known as: COSOPT  Place 1 drop into both eyes 2 (two) times daily.   Entresto  49-51 MG Generic drug: sacubitril -valsartan  Take 1 tablet by mouth 2 (two) times daily.   ezetimibe  10 MG tablet Commonly known as: ZETIA  Take 10 mg by mouth daily.   fexofenadine 180 MG tablet Commonly known as: ALLEGRA Take 180 mg by mouth daily.   fluticasone  50 MCG/ACT nasal spray Commonly  known as: FLONASE  Place into both nostrils.   Jardiance  10 MG Tabs tablet Generic drug: empagliflozin  Take 10 mg by mouth daily.   meclizine  25 MG tablet Commonly known as: ANTIVERT  Take 1 tablet (25 mg total) by mouth 3 (three) times daily as needed for dizziness.   metoprolol  succinate 50 MG 24 hr tablet Commonly known as: TOPROL -XL Take 50 mg by mouth daily.   montelukast  10 MG tablet Commonly known as: SINGULAIR  Take 10 mg by mouth daily.   nitroGLYCERIN  0.4 MG SL tablet Commonly known as: NITROSTAT  Place 0.4 mg under the tongue every 5 (five) minutes as needed. Chest pain.   pantoprazole  40 MG tablet Commonly known as: PROTONIX  Take 40 mg by mouth every morning.   Periogard 0.12 % solution Generic drug: chlorhexidine SMARTSIG:By Mouth   PreserVision AREDS Tabs Take 1 tablet by mouth 2 (two) times daily.   RABEprazole 20 MG tablet Commonly known as: ACIPHEX Take 20 mg by mouth daily.   Vitamin D3 25 MCG (1000 UT) Caps Take 1 capsule by mouth daily.   warfarin 5 MG tablet Commonly known as: COUMADIN  Take 5 mg by mouth daily.        Discharge Exam: Filed Weights   04/06/24 2118 04/07/24 1132  Weight: 68 kg 66.9 kg   HEENT:  [place]/AT, No thrush, no icterus CV:  RRR, no rub, no S3, no S4 Lung:  CTA, no wheeze, no rhonchi Abd:  soft/+BS, NT Ext:  No edema, no lymphangitis, no synovitis, no rash   Condition at discharge: stable  The results of significant diagnostics from this hospitalization (including imaging, microbiology, ancillary and laboratory) are listed below for reference.   Imaging Studies: ECHOCARDIOGRAM COMPLETE Result Date: 04/07/2024    ECHOCARDIOGRAM REPORT   Patient Name:   [name] Date of Exam: 04/07/2024 Medical Rec #:  [id number]      Height:       71.0 in Accession #:    [id number]     Weight:       150.0 lb Date of Birth:  10-27-1940      BSA:          1.866 m Patient Age:    83 years       BP:           159/59 mmHg Patient  Gender: M              HR:           61 bpm. Exam Location:  [facility] Procedure: 2D Echo, 3D Echo, Cardiac Doppler, Color Doppler and Strain Analysis            (Both Spectral and Color Flow Doppler were utilized during            procedure). Indications:    Chest Pain R07.9  History:        Patient has prior history of Echocardiogram examinations, most  recent 09/15/2022. CHF, CAD, Prior CABG and Pacemaker,                 Arrythmias:Atrial Fibrillation; Risk Factors:Dyslipidemia and                 Hypertension.  Sonographer:    Aida Pizza RCS Referring Phys: [phone] Araeya Lamb  Sonographer Comments: Global longitudinal strain was attempted. IMPRESSIONS  1. Left ventricular ejection fraction, by estimation, is 45 to 50%. Left ventricular ejection fraction by 3D volume is 53 %. The left ventricle has mildly decreased function. The left ventricle demonstrates regional wall motion abnormalities (see scoring diagram/findings for description). Left ventricular diastolic parameters are indeterminate. Elevated left atrial pressure. There is hypokinesis of the left ventricular, apical segment. The average left ventricular global longitudinal strain is -11.6 %. The global longitudinal strain is abnormal.  2. Right ventricular systolic function is mildly reduced. The right ventricular size is normal. There is normal pulmonary artery systolic pressure. The estimated right ventricular systolic pressure is 26.0 mmHg.  3. The mitral valve is degenerative. Mild mitral valve regurgitation. No evidence of mitral stenosis. Moderate mitral annular calcification.  4. Tricuspid valve regurgitation is mild to moderate.  5. The aortic valve is calcified. There is severe calcifcation of the aortic valve. There is severe thickening of the aortic valve. Aortic valve regurgitation is mild to moderate. Mild to moderate aortic valve stenosis. Aortic regurgitation PHT measures  514 msec. Aortic valve area, by VTI measures 1.32  cm. Aortic valve mean gradient measures 14.0 mmHg. Aortic valve Vmax measures 2.54 m/s. DI 0.42.  6. The inferior vena cava is normal in size with greater than 50% respiratory variability, suggesting right atrial pressure of 3 mmHg. FINDINGS  Left Ventricle: Left ventricular ejection fraction, by estimation, is 45 to 50%. Left ventricular ejection fraction by 3D volume is 53 %. The left ventricle has mildly decreased function. The left ventricle demonstrates regional wall motion abnormalities. The average left ventricular global longitudinal strain is -11.6 %. Strain was performed and the global longitudinal strain is abnormal. The left ventricular internal cavity size was normal in size. There is no left ventricular hypertrophy. Left ventricular diastolic parameters are indeterminate. Elevated left atrial pressure. Right Ventricle: The right ventricular size is normal. No increase in right ventricular wall thickness. Right ventricular systolic function is mildly reduced. There is normal pulmonary artery systolic pressure. The tricuspid regurgitant velocity is 2.40 m/s, and with an assumed right atrial pressure of 3 mmHg, the estimated right ventricular systolic pressure is 26.0 mmHg. Left Atrium: Left atrial size was normal in size. Right Atrium: Right atrial size was normal in size. Pericardium: There is no evidence of pericardial effusion. Mitral Valve: The mitral valve is degenerative in appearance. Moderate mitral annular calcification. Mild mitral valve regurgitation. No evidence of mitral valve stenosis. Tricuspid Valve: The tricuspid valve is normal in structure. Tricuspid valve regurgitation is mild to moderate. No evidence of tricuspid stenosis. Aortic Valve: The aortic valve is calcified. There is severe calcifcation of the aortic valve. There is severe thickening of the aortic valve. Aortic valve regurgitation is mild to moderate. Aortic regurgitation PHT measures 514 msec. Mild to moderate aortic  stenosis is present. Aortic valve mean gradient measures 14.0 mmHg. Aortic valve peak gradient measures 25.9 mmHg. Aortic valve area, by VTI measures 1.32 cm. Pulmonic Valve: The pulmonic valve was normal in structure. Pulmonic valve regurgitation is not visualized. No evidence of pulmonic stenosis. Aorta: The aortic root is normal in size and  structure. Venous: The inferior vena cava is normal in size with greater than 50% respiratory variability, suggesting right atrial pressure of 3 mmHg. IAS/Shunts: No atrial level shunt detected by color flow Doppler. Additional Comments: 3D was performed not requiring image post processing on an independent workstation and was abnormal.  LEFT VENTRICLE PLAX 2D LVIDd:         5.00 cm         Diastology LVIDs:         3.40 cm         LV e' medial:    5.47 cm/s LV PW:         1.00 cm         LV E/e' medial:  17.1 LV IVS:        1.00 cm         LV e' lateral:   8.33 cm/s LVOT diam:     2.00 cm         LV E/e' lateral: 11.3 LV SV:         75 LV SV Index:   40              2D Longitudinal LVOT Area:     3.14 cm        Strain                                2D Strain GLS   -10.8 %                                (A4C):                                2D Strain GLS   -11.3 %                                (A3C):                                2D Strain GLS   -12.6 %                                (A2C):                                2D Strain GLS   -11.6 %                                Avg:                                 3D Volume EF                                LV 3D EF:    Left  ventricul                                             ar                                             ejection                                             fraction                                             by 3D                                             volume is                                             53 %.                                 3D Volume EF:                                 3D EF:        53 %                                LV EDV:       156 ml                                LV ESV:       73 ml                                LV SV:        83 ml RIGHT VENTRICLE RV S prime:     9.95 cm/s TAPSE (M-mode): 1.6 cm LEFT ATRIUM             Index        RIGHT ATRIUM           Index LA diam:        4.00 cm 2.14 cm/m   RA Area:     17.70 cm LA Vol (A2C):   63.2 ml 33.87 ml/m  RA Volume:   46.20 ml  24.76 ml/m LA Vol (A4C):   44.9 ml 24.06 ml/m LA Biplane Vol: 54.0 ml 28.94 ml/m  AORTIC VALVE AV Area (Vmax):    1.53 cm AV Area (Vmean):  1.47 cm AV Area (VTI):     1.32 cm AV Vmax:           254.33 cm/s AV Vmean:          171.000 cm/s AV VTI:            0.567 m AV Peak Grad:      25.9 mmHg AV Mean Grad:      14.0 mmHg LVOT Vmax:         124.00 cm/s LVOT Vmean:        79.800 cm/s LVOT VTI:          0.238 m LVOT/AV VTI ratio: 0.42 AI PHT:            514 msec  AORTA Ao Root diam: 3.50 cm MITRAL VALVE                TRICUSPID VALVE MV Area (PHT): 4.68 cm     TR Peak grad:   23.0 mmHg MV Decel Time: 162 msec     TR Vmax:        240.00 cm/s MV E velocity: 93.80 cm/s MV A velocity: 111.00 cm/s  SHUNTS MV E/A ratio:  0.85         Systemic VTI:  0.24 m                             Systemic Diam: 2.00 cm Wilbert Bihari MD Electronically signed by Wilbert Bihari MD Signature Date/Time: 04/07/2024/1:30:35 PM    Final    DG Chest Port 1 View Result Date: 04/06/2024 CLINICAL DATA:  Chest pains, shortness of breath, history of heart attack and pacemaker. EXAM: PORTABLE CHEST 1 VIEW COMPARISON:  Portable chest and chest CT with contrast both 09/14/2022. FINDINGS: Stable cardiomegaly with CABG changes. No vascular congestion or edema is seen. The sulci are sharp. The lungs are clear of infiltrates. There are chronic changes in the left lower lung field. Left chest dual lead pacing system and wire insertions are unaltered. There is a tortuous aorta with atherosclerosis with stable  mediastinum. Osteopenia with no new osseous finding. IMPRESSION: No evidence of acute chest disease. Stable cardiomegaly and postsurgical changes. Aortic atherosclerosis. Electronically Signed   By: Francis Quam M.D.   On: 04/06/2024 23:01    Microbiology: Results for orders placed or performed during the hospital encounter of 09/14/22  Resp panel by RT-PCR (RSV, Flu A&B, Covid) Anterior Nasal Swab     Status: None   Collection Time: 09/14/22 10:35 AM   Specimen: Anterior Nasal Swab  Result Value Ref Range Status   SARS Coronavirus 2 by RT PCR NEGATIVE NEGATIVE Final    Comment: (NOTE) SARS-CoV-2 target nucleic acids are NOT DETECTED.  The SARS-CoV-2 RNA is generally detectable in upper respiratory specimens during the acute phase of infection. The lowest concentration of SARS-CoV-2 viral copies this assay can detect is 138 copies/mL. A negative result does not preclude SARS-Cov-2 infection and should not be used as the sole basis for treatment or other patient management decisions. A negative result may occur with  improper specimen collection/handling, submission of specimen other than nasopharyngeal swab, presence of viral mutation(s) within the areas targeted by this assay, and inadequate number of viral copies(<138 copies/mL). A negative result must be combined with clinical observations, patient history, and epidemiological information. The expected result is Negative.  Fact Sheet for Patients:  BloggerCourse.com  Fact Sheet for Healthcare Providers:  SeriousBroker.it  This test is no t yet approved or cleared by the [place]  FDA and  has been authorized for detection and/or diagnosis of SARS-CoV-2 by FDA under an Emergency Use Authorization (EUA). This EUA will remain  in effect (meaning this test can be used) for the duration of the COVID-19 declaration under Section 564(b)(1) of the Act, 21 U.S.C.section  360bbb-3(b)(1), unless the authorization is terminated  or revoked sooner.       Influenza A by PCR NEGATIVE NEGATIVE Final   Influenza B by PCR NEGATIVE NEGATIVE Final    Comment: (NOTE) The Xpert Xpress SARS-CoV-2/FLU/RSV plus assay is intended as an aid in the diagnosis of influenza from Nasopharyngeal swab specimens and should not be used as a sole basis for treatment. Nasal washings and aspirates are unacceptable for Xpert Xpress SARS-CoV-2/FLU/RSV testing.  Fact Sheet for Patients: BloggerCourse.com  Fact Sheet for Healthcare Providers: SeriousBroker.it  This test is not yet approved or cleared by the [place]  FDA and has been authorized for detection and/or diagnosis of SARS-CoV-2 by FDA under an Emergency Use Authorization (EUA). This EUA will remain in effect (meaning this test can be used) for the duration of the COVID-19 declaration under Section 564(b)(1) of the Act, 21 U.S.C. section 360bbb-3(b)(1), unless the authorization is terminated or revoked.     Resp Syncytial Virus by PCR NEGATIVE NEGATIVE Final    Comment: (NOTE) Fact Sheet for Patients: BloggerCourse.com  Fact Sheet for Healthcare Providers: SeriousBroker.it  This test is not yet approved or cleared by the [place]  FDA and has been authorized for detection and/or diagnosis of SARS-CoV-2 by FDA under an Emergency Use Authorization (EUA). This EUA will remain in effect (meaning this test can be used) for the duration of the COVID-19 declaration under Section 564(b)(1) of the Act, 21 U.S.C. section 360bbb-3(b)(1), unless the authorization is terminated or revoked.  Performed at [facility], [address], [place], [place] [address]     Labs: CBC: Recent Labs  Lab 04/06/24 2218  WBC 9.0  NEUTROABS 6.8  HGB 14.7  HCT 43.9  MCV 95.4  PLT 195   Basic Metabolic Panel: Recent Labs   Lab 04/06/24 2218  NA 137  K 3.8  CL 104  CO2 23  GLUCOSE 113*  BUN 17  CREATININE 0.81  CALCIUM  8.5*   Liver Function Tests: Recent Labs  Lab 04/06/24 2218  AST 31  ALT 36  ALKPHOS 47  BILITOT 0.8  PROT 6.5  ALBUMIN  3.6   CBG: No results for input(s): GLUCAP in the last 168 hours.  Discharge time spent: greater than 30 minutes.  Signed: Alm Schneider, MD [facility] 04/07/2024

## 2024-04-07 NOTE — H&P (Signed)
 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  The patient states that he has been feeling somewhat fatigued over the past 2 to 3 days.  On the morning of 04/06/2024 he developed some left-sided chest pain and shortness of breath that lasted about 5 minutes.  This was at rest.  He went about his day and got his usual influenza vaccination.  After he went home he took a shower.  Around 7 PM, the patient developed chest pain and shortness of breath once again lasting about 4 to 5 minutes.  He described as a sharp sensation that is left-sided.  He denied any dizziness or syncope.  He denies any fevers, chills, hemoptysis, nausea, vomiting, diarrhea.  He denies any hematochezia or melena. In the ED, the patient was afebrile and hemodynamically stable with oxygen saturation 96% on room air.  WBC 9.0, hemoglobin 14.7, platelet 195.  Sodium 137, potassium 3.8, bicarbonate 23, serum creatinine 0.81.  LFTs were unremarkable.  EKG sinus rhythm nonspecific ST changes.  Chest x-ray was negative for any acute findings.  Troponin 10>>10.  D-dimer 0.6.  BNP 105.  INR 2.7.  UA was negative for pyuria.  EDP spoke with cardiology, and they requested transfer to [facility] for further evaluation and treatment.   Review of Systems: As mentioned in the history of present illness. All other systems reviewed and are negative. Past Medical History:  Diagnosis Date   Dysrhythmia     dr ailene  in  [place]   Glaucoma    Hypertension    Myocardial infarction [facility])    2005   Past Surgical History:  Procedure Laterality Date   CORONARY ARTERY BYPASS GRAFT     2005   HERNIA REPAIR     INSERT / REPLACE / REMOVE PACEMAKER     [place]   dr ailene meo   Social History:  reports that he has never smoked. He has never used smokeless tobacco. He reports that he does not drink alcohol and does not use drugs.  No Known Allergies  History reviewed. No pertinent family history.  Prior to Admission medications   Medication Sig Start Date End Date Taking? Authorizing Provider  Ascorbic Acid  100 MG CHEW Chew 1,000 mg by mouth daily.    [provider]  aspirin  EC 81 MG tablet Take 81 mg by mouth daily.    [provider]  atorvastatin  (LIPITOR ) 80 MG tablet Take 80 mg by mouth daily.    [provider]  digoxin  (LANOXIN ) 0.125 MG tablet Take 125 mcg by mouth daily.    [provider]  dorzolamide -timolol  (COSOPT ) 22.3-6.8 MG/ML ophthalmic solution Place 1 drop into both eyes 2 (two) times daily.    [provider]  ezetimibe  (ZETIA ) 10 MG tablet Take 10 mg by mouth daily.    [provider]  fexofenadine (ALLEGRA) 180 MG tablet Take 180 mg by mouth daily.    [provider]  fluticasone  (FLONASE ) 50 MCG/ACT nasal spray  Place into both nostrils.    [provider]  furosemide  (LASIX ) 40 MG tablet Take 1 tablet (40 mg total) by mouth daily. 09/18/22 10/18/22  Christobal Guadalajara, MD  meclizine  (ANTIVERT ) 25 MG tablet Take 1 tablet (25 mg total) by mouth 3 (three) times daily as needed for dizziness. 12/14/23   Cleotilde Rogue, MD  metoprolol  succinate (TOPROL -XL) 50 MG 24 hr tablet Take 50 mg by mouth daily. 08/29/22   [provider]  montelukast  (SINGULAIR ) 10 MG tablet Take 10 mg by mouth daily.    [provider]  Multiple Vitamins-Minerals (PRESERVISION AREDS) TABS Take by mouth.    [provider]   nitroGLYCERIN  (NITROSTAT ) 0.4 MG SL tablet Place 0.4 mg under the tongue every 5 (five) minutes as needed. Chest pain.    [provider]  pantoprazole  (PROTONIX ) 40 MG tablet Take 40 mg by mouth every morning.    [provider]  PERIOGARD 0.12 % solution SMARTSIG:By Mouth 08/13/22   [provider]  potassium chloride  SA (KLOR-CON  M) 20 MEQ tablet Take 1 tablet (20 mEq total) by mouth daily for 7 days. 09/17/22 09/24/22  Christobal Guadalajara, MD  spironolactone  (ALDACTONE ) 25 MG tablet Take 0.5 tablets (12.5 mg total) by mouth daily. 09/18/22 10/18/22  Christobal Guadalajara, MD  warfarin (COUMADIN ) 5 MG tablet Take 5 mg by mouth daily.    [provider]    Physical Exam: Vitals:   04/07/24 0100 04/07/24 0200 04/07/24 0240 04/07/24 0405  BP: (!) 138/57 (!) 171/74 (!) 153/77 (!) 147/62  Pulse: 62 67 90 63  Resp: 17 17 20 18   Temp: 98 F (36.7 C)  98 F (36.7 C)   TempSrc:      SpO2: 93% 97% 95% 96%  Weight:      Height:       GENERAL:  A&O x 3, NAD, well developed, cooperative, follows commands HEENT: [place]/AT, No thrush, No icterus, No oral ulcers Neck:  No neck mass, No meningismus, soft, supple CV: RRR, no S3, no S4, no rub, no JVD Lungs:  CTA, no wheeze, no rhonchi, good air movement Abd: soft/NT +BS, nondistended Ext: 1 + LLE edema, no lymphangitis, no cyanosis, no rashes Neuro:  CN II-XII intact, strength 4/5 in RUE, RLE, strength 4/5 LUE, LLE; sensation intact bilateral; no dysmetria; babinski equivocal  Data Reviewed: Data reviewed above in the history  Assessment and Plan: Chest pain - Concerning for angina - Cardiology consult - Troponin 10>> 10 - Chest x-ray negative for infiltrates or edema - Continue aspirin  - Echocardiogram - Lipid panel  Chronic HFmrEF - Clinically compensated - 09/15/2022 echo EF 40-45%, normal RVF, mild to moderate TR - Continue digoxin   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 Observation status is judged to be reasonable and necessary in order to provide the required intensity of service to ensure the patient's safety. The patient's presenting symptoms, physical exam findings, and initial radiographic and laboratory data in the context of their medical condition is felt to place  them at decreased risk for further clinical deterioration. Furthermore, it is anticipated that the patient will be medically stable for discharge from the hospital within 2 midnights of admission.   Author: Alm Schneider, MD 04/07/2024 7:49 AM  For on call review [email or web address].

## 2024-04-07 NOTE — Progress Notes (Signed)
 Patient has discharge orders, discharge teaching given and no further questions at this time.

## 2024-04-07 NOTE — Consult Note (Signed)
 Cardiology Consultation   Patient ID: [name] MRN: [id number]; DOB: 1940/11/26  Admit date: 04/06/2024 Date of Consult: 04/07/2024  PCP:  Timothy Gambles, MD   [name] Cardiologist:  [name], [place])   Patient Profile: [name] is a 83 y.o. male with a hx of CAD (s/p CABG in 2004 with LIMA-LAD, SVG-D1 and SVG-PDA, cath in 2010 at [facility] with PCI alone per patient's report but no stent placed), chronic HFmrEF (EF 40-45% by echo in 08/2022),  SSS (s/p Medtronic PPM placement, gen change in 08/2022), PAD (known left SFA occlusion - PCI at [facility] in 09/2021), paroxysmal atrial fibrillation/flutter, HTN and HLD who is being seen 04/07/2024 for the evaluation of chest pain at the request of [name].  History of Present Illness:  [name] presented to [name] ED yesterday evening for evaluation chest pain. In talking with the patient today, he reports he is usually active at baseline and was previously mowing his yard just last week with a push mower and weed eating without any symptoms. Over the past 3 days, he does report having decreased energy. Yesterday morning, he reports being short of breath upon waking from sleep but this resolved within a few minutes. He received his flu shot around lunchtime. Yesterday evening, after consuming spicy shrimp and rice, he reports developing 1 episode of a stabbing chest pain. Took sublingual nitroglycerin  x 1 and pain resolved. His wife had already called EMS, therefore he came to the ED for further evaluation. He denies any recurrent pain overnight or this morning.  Breathing is back to baseline. Does report having chronic edema along his left leg which has occurred since prior vein graft harvesting. Typically wears compression stockings for this. He does try to consume a low-sodium diet. Reports having known acid reflux and recently underwent esophageal dilatation in 12/2023 due to food impaction. Says  that he did have a repeat echocardiogram with his primary cardiologist about a month ago and was informed that his EF was the same.  Initial labs showed WBC 9.0, Hgb 14.7, platelets 195, Na+ 137, K+ 3.8 and creatinine 0.81.  BNP 105.  Initial and repeat Hs Troponin values negative at 10. INR 2.5. CXR with no acute abnormalities. EKG shows A-paced rhythm, HR 60.   Past Medical History:  Diagnosis Date   Dysrhythmia     dr Timothy  in [place]   Glaucoma    Hypertension    Myocardial infarction [facility])    2005    Past Surgical History:  Procedure Laterality Date   CORONARY ARTERY BYPASS GRAFT     2005   HERNIA REPAIR     INSERT / REPLACE / REMOVE PACEMAKER     [place]   dr Timothy meo     Home Medications:  Prior to Admission medications   Medication Sig Start Date End Date Taking? Authorizing Provider  Ascorbic Acid  100 MG CHEW Chew 1,000 mg by mouth daily.   Yes [provider]  aspirin  EC 81 MG tablet Take 81 mg by mouth daily.   Yes [provider]  atorvastatin  (LIPITOR ) 80 MG tablet Take 80 mg by mouth daily.   Yes [provider]  Cholecalciferol  (VITAMIN D3) 25 MCG (1000 UT) CAPS Take 1 capsule by mouth daily.   Yes [provider]  digoxin  (LANOXIN ) 0.125 MG tablet Take 125 mcg by mouth daily.   Yes [provider]  dorzolamide -timolol  (COSOPT ) 22.3-6.8 MG/ML ophthalmic  solution Place 1 drop into both eyes 2 (two) times daily.   Yes [provider]  ENTRESTO  49-51 MG Take 1 tablet by mouth 2 (two) times daily.   Yes [provider]  ezetimibe  (ZETIA ) 10 MG tablet Take 10 mg by mouth daily.   Yes [provider]  fexofenadine (ALLEGRA) 180 MG tablet Take 180 mg by mouth daily.   Yes [provider]  fluticasone  (FLONASE ) 50 MCG/ACT nasal spray Place into both nostrils.   Yes [provider]  JARDIANCE  10 MG TABS tablet Take 10 mg by mouth daily.   Yes [provider]   meclizine  (ANTIVERT ) 25 MG tablet Take 1 tablet (25 mg total) by mouth 3 (three) times daily as needed for dizziness. 12/14/23  Yes Cleotilde Rogue, MD  metoprolol  succinate (TOPROL -XL) 50 MG 24 hr tablet Take 50 mg by mouth daily. 08/29/22  Yes [provider]  montelukast  (SINGULAIR ) 10 MG tablet Take 10 mg by mouth daily.   Yes [provider]  Multiple Vitamins-Minerals (PRESERVISION AREDS) TABS Take 1 tablet by mouth 2 (two) times daily.   Yes [provider]  nitroGLYCERIN  (NITROSTAT ) 0.4 MG SL tablet Place 0.4 mg under the tongue every 5 (five) minutes as needed. Chest pain.   Yes [provider]  PERIOGARD 0.12 % solution SMARTSIG:By Mouth 08/13/22  Yes [provider]  RABEprazole (ACIPHEX) 20 MG tablet Take 20 mg by mouth daily.   Yes [provider]  warfarin (COUMADIN ) 5 MG tablet Take 5 mg by mouth daily.   Yes [provider]  pantoprazole  (PROTONIX ) 40 MG tablet Take 40 mg by mouth every morning.    [provider]    Scheduled Meds:  ascorbic acid   1,000 mg Oral Daily   aspirin  EC  81 mg Oral Daily   atorvastatin   80 mg Oral Daily   digoxin   125 mcg Oral Daily   dorzolamide -timolol   1 drop Both Eyes BID   empagliflozin   10 mg Oral Daily   ezetimibe   10 mg Oral Daily   loratadine   10 mg Oral Daily   metoprolol  succinate  50 mg Oral Daily   montelukast   10 mg Oral Daily   pantoprazole   40 mg Oral q morning   sacubitril -valsartan   1 tablet Oral BID   Continuous Infusions:  PRN Meds: acetaminophen  **OR** acetaminophen , ondansetron  **OR** ondansetron  (ZOFRAN ) IV  Allergies:   No Known Allergies  Social History:   Social History   Socioeconomic History   Marital status: Married    Spouse name: Not on file   Number of children: Not on file   Years of education: Not on file   Highest education level: Not on file  Occupational History   Not on file  Tobacco Use   Smoking status: Never   Smokeless  tobacco: Never  Vaping Use   Vaping status: Never Used  Substance and Sexual Activity   Alcohol use: No   Drug use: No   Sexual activity: Yes  Other Topics Concern   Not on file  Social History Narrative   Not on file    Family History:   Family History  Problem Relation Age of Onset   Heart attack Father      ROS:  Please see the history of present illness.   All other ROS reviewed and negative.     Physical Exam/Data: Vitals:   04/07/24 0800 04/07/24 0823 04/07/24 0831 04/07/24 0959  BP: (!) 152/58   ROLLEN)  159/59  Pulse: 60   61  Resp: (!) 21     Temp:   (!) 97.5 F (36.4 C) (!) 97.5 F (36.4 C)  TempSrc:   Oral Oral  SpO2: 97% 98%  98%  Weight:      Height:       No intake or output data in the 24 hours ending 04/07/24 1032    04/06/2024    9:18 PM 12/14/2023    9:57 AM 09/17/2022    5:00 AM  Last 3 Weights  Weight (lbs) 150 lb 158 lb 11.7 oz 158 lb 4.6 oz  Weight (kg) 68.04 kg 72 kg 71.8 kg     Body mass index is 20.92 kg/m.  General:  Well nourished, well developed, in no acute distress HEENT: normal Neck: no JVD Vascular: No carotid bruits; Distal pulses 2+ bilaterally Cardiac:  normal S1, S2; RRR; 2/6 SEM along sternal border.  Lungs:  clear to auscultation bilaterally, no wheezing, rhonchi or rales  Abd: soft, nontender, no hepatomegaly  Ext: 1+ pitting edema along left lower extremity, no edema along right lower extremity. Musculoskeletal:  No deformities, BUE and BLE strength normal and equal Skin: warm and dry  Neuro:  CNs 2-12 intact, no focal abnormalities noted Psych:  Normal affect   EKG:  The EKG was personally reviewed and demonstrates: A-paced rhythm, HR 60.  Relevant CV Studies:  Echocardiogram: 08/2022 IMPRESSIONS     1. Left ventricular ejection fraction, by estimation, is 40 to 45%. The  left ventricle has mildly decreased function. The left ventricle  demonstrates regional wall motion abnormalities (see scoring   diagram/findings for description). There is mild  concentric left ventricular hypertrophy. Left ventricular diastolic  parameters are indeterminate.   2. Right ventricular systolic function is normal. The right ventricular  size is normal. There is normal pulmonary artery systolic pressure. The  estimated right ventricular systolic pressure is 34.6 mmHg.   3. The mitral valve is degenerative, posterior leaflet restricted.  Moderate mitral valve regurgitation.   4. Tricuspid valve regurgitation is mild to moderate.   5. The aortic valve is tricuspid. There is moderate calcification of the  aortic valve. Aortic valve regurgitation is moderate. Mild aortic valve  stenosis. Aortic valve mean gradient measures 12.5 mmHg. Dimentionless  index 0.40.   6. The inferior vena cava is normal in size with <50% respiratory  variability, suggesting right atrial pressure of 8 mmHg.   Comparison(s): No prior Echocardiogram.   Laboratory Data: High Sensitivity Troponin:   Recent Labs  Lab 04/06/24 2218 04/07/24 0056  TROPONINIHS 10 10     Chemistry Recent Labs  Lab 04/06/24 2218  NA 137  K 3.8  CL 104  CO2 23  GLUCOSE 113*  BUN 17  CREATININE 0.81  CALCIUM  8.5*  GFRNONAA >60  ANIONGAP 10    Recent Labs  Lab 04/06/24 2218  PROT 6.5  ALBUMIN  3.6  AST 31  ALT 36  ALKPHOS 47  BILITOT 0.8   Lipids No results for input(s): CHOL, TRIG, HDL, LABVLDL, LDLCALC, CHOLHDL in the last 168 hours.  Hematology Recent Labs  Lab 04/06/24 2218  WBC 9.0  RBC 4.60  HGB 14.7  HCT 43.9  MCV 95.4  MCH 32.0  MCHC 33.5  RDW 13.3  PLT 195   Thyroid No results for input(s): TSH, FREET4 in the last 168 hours.  BNP Recent Labs  Lab 04/06/24 2218  BNP 105.0*    DDimer  Recent Labs  Lab 04/06/24  2218  DDIMER 0.86*    Radiology/Studies:  DG Chest Port 1 View Result Date: 04/06/2024 CLINICAL DATA:  Chest pains, shortness of breath, history of heart attack and pacemaker.  EXAM: PORTABLE CHEST 1 VIEW COMPARISON:  Portable chest and chest CT with contrast both 09/14/2022. FINDINGS: Stable cardiomegaly with CABG changes. No vascular congestion or edema is seen. The sulci are sharp. The lungs are clear of infiltrates. There are chronic changes in the left lower lung field. Left chest dual lead pacing system and wire insertions are unaltered. There is a tortuous aorta with atherosclerosis with stable mediastinum. Osteopenia with no new osseous finding. IMPRESSION: No evidence of acute chest disease. Stable cardiomegaly and postsurgical changes. Aortic atherosclerosis. Electronically Signed   By: Francis Quam M.D.   On: 04/06/2024 23:01     Assessment and Plan:  1. Atypical Chest Pain - He reports only 1 isolated episode of stabbing pain which lasted for less than 5 minutes and resolved with SL NTG x1. Was not associated with exertion and he has been very active at home over the past few weeks without any symptoms. Feels back to baseline today. - Hs troponin values have been negative at 10. Repeat echocardiogram is pending to assess for any structural abnormalities. Will see if we can obtain records from [place] as he reports having an echocardiogram there within the past few months. If this is overall similar to prior imaging, would not anticipate further ischemic evaluation at this time.  He can follow-up with his primary cardiologist for consideration of a Cardiac PET or Lexiscan Myoview. If EF has further declined or new WMA noted, would pursue repeat cath this admission. Coumadin  on hold but INR at 2.7 today.   2. CAD -  He is s/p CABG in 2004 with LIMA-LAD, SVG-D1 and SVG-PDA, cath in 2010 at [facility] with PCI alone per patient's report but no stent placed. - Hs troponin values have been negative this admission and EKG shows A-paced rhythm. - Remains on ASA 81 mg daily, Atorvastatin  80 mg daily and Toprol -XL 50 mg daily. Will plan for a repeat  echocardiogram as discussed above.  3. Chronic HFmrEF - He has a known cardiomyopathy with EF at 40 to 45% echocardiogram in 08/2022 and he does report having a recent echocardiogram in [place] within the past few months with similar results. Repeat echocardiogram is pending this admission.  - Initial episode of shortness of breath yesterday is concerning for orthopnea but this has now resolved and BNP was only mildly elevated at 105. He has chronic edema along his left leg but no other signs of volume overload. Continue current medical therapy with the Digoxin  125 mcg daily, Jardiance  10 mg daily, Toprol -XL 50 mg daily and Entresto  49-51 mg twice daily.  4. SSS - He is s/p Medtronic PPM placement with gen change in 08/2022. Followed by Cardiology in [place], [place].    5. PAD - Underwent PCI of left SFA occlusion at [facility] in 09/2021. No recent claudication symptoms. Remains on ASA 81 mg daily, Atorvastatin  80 mg daily and Zetia  10 mg daily.  6. Paroxysmal Atrial Fibrillation/Flutter - In NSR at this time. Currently on Digoxin  125 mcg daily and Toprol -XL 50mg  daily.  - On Coumadin  for anticoagulation prior to admission. Pharmacy has been consulted to start Heparin once INR less than 2 in case he needs catheterization. INR 2.7 today.   7. Mitral valve regurgitation  - Echocardiogram in 08/2022 showed moderate MR and also had mild AS.  Repeat echocardiogram pending   Risk Assessment/Risk Scores:  CHA2DS2-VASc Score = 5   This indicates a 7.2% annual risk of stroke. The patient's score is based upon: CHF History: 1 HTN History: 1 Diabetes History: 0 Stroke History: 0 Vascular Disease History: 1 Age Score: 2 Gender Score: 0    For questions or updates, please contact [facility] Please consult [email or web address] for contact info under    Signed, Laymon CHRISTELLA Qua, PA-C  04/07/2024 10:32 AM

## 2024-04-08 ENCOUNTER — Encounter: Payer: Self-pay | Admitting: Specialist

## 2024-04-13 ENCOUNTER — Telehealth: Payer: Self-pay | Admitting: Student

## 2024-04-13 NOTE — Telephone Encounter (Signed)
 Per B.Strader,PA-C, patient is seen by outside cardiologist, Dr.Chauhan and needs to follow up there. Office appointment was cancelled for tomorrow.  I spoke with patient and he states he saw cardiologist, Dr.Chauhan yesterday and he wants him to have a stress test done. Per B.Strader, PA-C, I relayed to patient and Elenor at MD office, [phone] that we do not order stress test from outside [facility] providers .Elenor tells me they do stress test there once a month and she will call patient to schedule.

## 2024-04-13 NOTE — Telephone Encounter (Signed)
 Patient states that the nurse called him. Please advise

## 2024-04-14 ENCOUNTER — Ambulatory Visit: Payer: PRIVATE HEALTH INSURANCE | Admitting: Student
